# Patient Record
Sex: Female | Born: 1990 | Race: White | Hispanic: No | Marital: Married | Smoking: Never smoker
Health system: Southern US, Community
[De-identification: ages and names within clinical notes are randomized; demographics above are authoritative.]

## PROBLEM LIST (undated history)

## (undated) ENCOUNTER — Inpatient Hospital Stay: Payer: Self-pay

## (undated) DIAGNOSIS — F419 Anxiety disorder, unspecified: Secondary | ICD-10-CM

## (undated) HISTORY — PX: NO PAST SURGERIES: SHX2092

---
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
## 2012-11-12 ENCOUNTER — Emergency Department: Payer: Self-pay | Admitting: Emergency Medicine

## 2012-11-13 LAB — BASIC METABOLIC PANEL
Anion Gap: 8 (ref 7–16)
BUN: 7 mg/dL (ref 7–18)
Chloride: 106 mmol/L (ref 98–107)
Co2: 24 mmol/L (ref 21–32)
Creatinine: 0.76 mg/dL (ref 0.60–1.30)
EGFR (African American): >60
EGFR (Non-African Amer.): >60
Glucose: 83 mg/dL (ref 65–99)
Potassium: 3.6 mmol/L (ref 3.5–5.1)

## 2012-11-13 LAB — CBC WITH DIFFERENTIAL/PLATELET
Basophil #: 0 10*3/uL (ref 0.0–0.1)
Basophil %: 0.6 %
Eosinophil #: 0 10*3/uL (ref 0.0–0.7)
HCT: 39.2 % (ref 35.0–47.0)
MCV: 90 fL (ref 80–100)
Monocyte #: 0.7 x10 3/mm (ref 0.2–0.9)
Neutrophil %: 72.5 %
Platelet: 219 10*3/uL (ref 150–440)
RBC: 4.37 10*6/uL (ref 3.80–5.20)
WBC: 7.6 10*3/uL (ref 3.6–11.0)

## 2012-11-13 LAB — URINALYSIS, COMPLETE
Ketone: NEGATIVE
Ph: 5 (ref 4.5–8.0)
Protein: NEGATIVE

## 2014-09-25 ENCOUNTER — Emergency Department: Payer: Self-pay | Admitting: Emergency Medicine

## 2014-09-26 LAB — COMPREHENSIVE METABOLIC PANEL
ALBUMIN: 3.9 g/dL (ref 3.4–5.0)
ANION GAP: 6 — AB (ref 7–16)
Alkaline Phosphatase: 38 U/L — ABNORMAL LOW (ref 46–116)
BILIRUBIN TOTAL: 0.2 mg/dL (ref 0.2–1.0)
BUN: 10 mg/dL (ref 7–18)
CREATININE: 0.97 mg/dL (ref 0.60–1.30)
Calcium, Total: 8.3 mg/dL — ABNORMAL LOW (ref 8.5–10.1)
Chloride: 110 mmol/L — ABNORMAL HIGH (ref 98–107)
Co2: 25 mmol/L (ref 21–32)
EGFR (African American): >60
EGFR (Non-African Amer.): >60
Glucose: 110 mg/dL — ABNORMAL HIGH (ref 65–99)
Osmolality: 281 (ref 275–301)
POTASSIUM: 4.1 mmol/L (ref 3.5–5.1)
SGOT(AST): 27 U/L (ref 15–37)
SGPT (ALT): 21 U/L (ref 14–63)
Sodium: 141 mmol/L (ref 136–145)
TOTAL PROTEIN: 7.4 g/dL (ref 6.4–8.2)

## 2014-09-26 LAB — CBC WITH DIFFERENTIAL/PLATELET
BASOS PCT: 0.5 %
Basophil #: 0 10*3/uL (ref 0.0–0.1)
Eosinophil #: 0.1 10*3/uL (ref 0.0–0.7)
Eosinophil %: 1.5 %
HCT: 38.8 % (ref 35.0–47.0)
HGB: 12.8 g/dL (ref 12.0–16.0)
Lymphocyte #: 1.4 10*3/uL (ref 1.0–3.6)
Lymphocyte %: 21.9 %
MCH: 29.8 pg (ref 26.0–34.0)
MCHC: 32.9 g/dL (ref 32.0–36.0)
MCV: 91 fL (ref 80–100)
MONO ABS: 0.5 x10 3/mm (ref 0.2–0.9)
Monocyte %: 8.5 %
Neutrophil #: 4.2 10*3/uL (ref 1.4–6.5)
Neutrophil %: 67.6 %
Platelet: 183 10*3/uL (ref 150–440)
RBC: 4.28 10*6/uL (ref 3.80–5.20)
RDW: 12.7 % (ref 11.5–14.5)
WBC: 6.2 10*3/uL (ref 3.6–11.0)

## 2015-10-22 ENCOUNTER — Other Ambulatory Visit: Payer: Self-pay | Admitting: Orthopedic Surgery

## 2015-10-22 DIAGNOSIS — M2391 Unspecified internal derangement of right knee: Secondary | ICD-10-CM

## 2015-11-04 ENCOUNTER — Encounter (HOSPITAL_COMMUNITY): Payer: Self-pay | Admitting: *Deleted

## 2015-11-04 ENCOUNTER — Emergency Department (HOSPITAL_COMMUNITY)
Admission: EM | Admit: 2015-11-04 | Discharge: 2015-11-04 | Disposition: A | Discharge status: Home / Self Care | Payer: 59 | Attending: Emergency Medicine | Admitting: Emergency Medicine

## 2015-11-04 DIAGNOSIS — R42 Dizziness and giddiness: Secondary | ICD-10-CM | POA: Insufficient documentation

## 2015-11-04 DIAGNOSIS — H539 Unspecified visual disturbance: Secondary | ICD-10-CM | POA: Insufficient documentation

## 2015-11-04 DIAGNOSIS — F419 Anxiety disorder, unspecified: Secondary | ICD-10-CM | POA: Diagnosis not present

## 2015-11-04 DIAGNOSIS — R51 Headache: Secondary | ICD-10-CM | POA: Insufficient documentation

## 2015-11-04 DIAGNOSIS — R110 Nausea: Secondary | ICD-10-CM | POA: Insufficient documentation

## 2015-11-04 DIAGNOSIS — Z3202 Encounter for pregnancy test, result negative: Secondary | ICD-10-CM | POA: Diagnosis not present

## 2015-11-04 DIAGNOSIS — R55 Syncope and collapse: Secondary | ICD-10-CM | POA: Diagnosis present

## 2015-11-04 HISTORY — DX: Anxiety disorder, unspecified: F41.9

## 2015-11-04 LAB — CBC
HEMATOCRIT: 40.2 % (ref 36.0–46.0)
Hemoglobin: 13.4 g/dL (ref 12.0–15.0)
MCH: 29.8 pg (ref 26.0–34.0)
MCHC: 33.3 g/dL (ref 30.0–36.0)
MCV: 89.5 fL (ref 78.0–100.0)
Platelets: 235 10*3/uL (ref 150–400)
RBC: 4.49 MIL/uL (ref 3.87–5.11)
RDW: 12.9 % (ref 11.5–15.5)
WBC: 5.9 10*3/uL (ref 4.0–10.5)

## 2015-11-04 LAB — I-STAT BETA HCG BLOOD, ED (MC, WL, AP ONLY)

## 2015-11-04 LAB — BASIC METABOLIC PANEL
Anion gap: 7 (ref 5–15)
BUN: 12 mg/dL (ref 6–20)
CALCIUM: 9.2 mg/dL (ref 8.9–10.3)
CO2: 23 mmol/L (ref 22–32)
Chloride: 109 mmol/L (ref 101–111)
Creatinine, Ser: 0.78 mg/dL (ref 0.44–1.00)
GFR calc Af Amer: >60 mL/min (ref >60)
GLUCOSE: 102 mg/dL — AB (ref 65–99)
POTASSIUM: 4.3 mmol/L (ref 3.5–5.1)
Sodium: 139 mmol/L (ref 135–145)

## 2015-11-04 LAB — CBG MONITORING, ED: Glucose-Capillary: 84 mg/dL (ref 65–99)

## 2015-11-04 NOTE — Discharge Instructions (Signed)
Follow up with your doctor. Return for worsening symptoms.  Your EKG and blood work today are normal

## 2015-11-04 NOTE — ED Provider Notes (Signed)
CSN: [id number]     Arrival date & time 11/04/15  1447 History  By signing my name below, I, Gabriela Fuller, attest that this documentation has been prepared under the direction and in the presence of [facility] NP. Electronically Signed: Bethel BornBritney Fuller, ED Scribe. 11/04/2015 5:02 PM    Chief Complaint  Patient presents with  . Loss of Consciousness    Patient is a 25 y.o. female presenting with syncope. The history is provided by the patient. No language interpreter was used.  Loss of Consciousness Episode history:  Single Most recent episode:  Today Timing:  Constant Progression:  Resolved Chronicity:  New Context: normal activity   Witnessed: yes   Relieved by:  Nothing Worsened by:  Nothing tried Ineffective treatments:  None tried Associated symptoms: dizziness, headaches and nausea   Associated symptoms: no recent fall and no recent injury   Risk factors: no congenital heart disease, no seizures and no vascular disease    Gabriela Fuller is a 25 y.o. female who presents to the Emergency Department complaining of a syncopal episode this afternoon. Pt states that she was visiting a friend in the ICU and became suddenly nauseous before she "went limp". Pt states that during the episode she was pale and either closed her eyes or blacked out. Over the last few months she has had similar episodes that were less severe.  Presently she has a headache and notes feeling better. She has eaten since the episode but does not necessarily feel that food alleviated her symptoms.  Pt states that she started a new anti-anxiety medication (Paroxetine)  this week but this did not feel like an anxiety attack. Pt denies blurred vision, neck pain, and back pain.   Past Medical History  Diagnosis Date  . Anxiety    History reviewed. No pertinent past surgical history. History reviewed. No pertinent family history. Social History  Substance Use Topics  . Smoking status: Never Smoker   . Smokeless  tobacco: None  . Alcohol Use: No   OB History    No data available     Review of Systems  Eyes: Positive for visual disturbance.  Cardiovascular: Positive for syncope.  Gastrointestinal: Positive for nausea.  Musculoskeletal: Negative for back pain and neck pain.  Skin: Positive for color change.  Neurological: Positive for dizziness and headaches.  All other systems reviewed and are negative.   Allergies  Review of patient's allergies indicates no known allergies.  Home Medications   Prior to Admission medications   Not on File   BP 111/73 mmHg  Pulse 70  Temp(Src) 98.2 F (36.8 C) (Oral)  Resp 18  SpO2 99%  LMP 11/04/2015 Physical Exam  Constitutional: She is oriented to person, place, and time. She appears well-developed and well-nourished. No distress.  HENT:  Head: Normocephalic and atraumatic.  Right Ear: Tympanic membrane normal.  Left Ear: Tympanic membrane normal.  Nose: Nose normal.  Mouth/Throat: Uvula is midline, oropharynx is clear and moist and mucous membranes are normal.  Eyes: Conjunctivae and EOM are normal. Pupils are equal, round, and reactive to light.  Neck: Normal range of motion. Neck supple.  Cardiovascular: Normal rate and regular rhythm.   Pulmonary/Chest: Effort normal. No respiratory distress. She has no wheezes. She has no rales.  Abdominal: Soft. Bowel sounds are normal. There is no tenderness.  Musculoskeletal: Normal range of motion. She exhibits no edema.  Radial and pedal pulses strong, adequate circulation, good touch sensation.  Neurological: She is alert and  oriented to person, place, and time. She has normal strength. No cranial nerve deficit or sensory deficit. She displays a negative Romberg sign. Gait normal.  Reflex Scores:      Bicep reflexes are 2+ on the right side and 2+ on the left side.      Brachioradialis reflexes are 2+ on the right side and 2+ on the left side.      Patellar reflexes are 2+ on the right side and  2+ on the left side.      Achilles reflexes are 2+ on the right side and 2+ on the left side. Rapid alternating movement without difficulty. Stands on one foot without difficulty.  Skin: Skin is warm and dry.  Psychiatric: She has a normal mood and affect. Her behavior is normal.  Nursing note and vitals reviewed.   ED Course  Procedures (including critical care time) DIAGNOSTIC STUDIES: Oxygen Saturation is 99% on RA,  normal by my interpretation.    COORDINATION OF CARE: 4:55 PM Discussed treatment plan which includes lab work with pt at bedside and pt agreed to plan.  Labs Review Labs Reviewed  BASIC METABOLIC PANEL - Abnormal; Notable for the following:    Glucose, Bld 102 (*)    All other components within normal limits  CBC  CBG MONITORING, ED  I-STAT BETA HCG BLOOD, ED ([facility], [facility], [facility] ONLY)    Imaging Review No results found. I personally reviewed and evaluated these lab results as a part of my medical decision-making.  EKG: normal EKG, normal sinus rhythm with sinus arrhythmia    MDM  25 y.o. female with syncopal episode while visiting a patient here in the ICU. Episode happened after standing for a long time in a room that was very warm. Stable for d/c with normal neuro exam, labs and EKG. Discussed with the patient and all questioned fully answered. She will return if any problems arise.   Final diagnoses:  Vasovagal syncope   I personally performed the services described in this documentation, which was scribed in my presence. The recorded information has been reviewed and is accurate.    [address] [place], NP 11/04/15 1722  Rolan Bucco, MD 11/04/15 2158

## 2015-11-04 NOTE — ED Notes (Signed)
Pt reports coming to visit someone and had syncopal episode. Denies feeling bad prior, had onset of tingling to hands and nausea prior to syncopal episode. No acute distress noted at triage.

## 2015-11-08 ENCOUNTER — Ambulatory Visit: Payer: Self-pay

## 2015-11-12 ENCOUNTER — Ambulatory Visit
Admission: RE | Admit: 2015-11-12 | Discharge: 2015-11-12 | Disposition: A | Discharge status: Home / Self Care | Payer: 59 | Source: Ambulatory Visit | Attending: Orthopedic Surgery | Admitting: Orthopedic Surgery

## 2015-11-12 DIAGNOSIS — M2391 Unspecified internal derangement of right knee: Secondary | ICD-10-CM | POA: Diagnosis not present

## 2015-12-09 DIAGNOSIS — F32A Depression, unspecified: Secondary | ICD-10-CM | POA: Insufficient documentation

## 2015-12-09 DIAGNOSIS — F419 Anxiety disorder, unspecified: Secondary | ICD-10-CM | POA: Insufficient documentation

## 2016-01-22 ENCOUNTER — Other Ambulatory Visit: Payer: Self-pay | Admitting: Obstetrics and Gynecology

## 2016-01-22 DIAGNOSIS — Z369 Encounter for antenatal screening, unspecified: Secondary | ICD-10-CM

## 2016-02-20 LAB — OB RESULTS CONSOLE HIV ANTIBODY (ROUTINE TESTING): HIV: NONREACTIVE

## 2016-02-20 LAB — OB RESULTS CONSOLE ABO/RH: RH Type: POSITIVE

## 2016-02-20 LAB — OB RESULTS CONSOLE RUBELLA ANTIBODY, IGM: Rubella: IMMUNE

## 2016-02-20 LAB — OB RESULTS CONSOLE VARICELLA ZOSTER ANTIBODY, IGG: Varicella: NON-IMMUNE/NOT IMMUNE

## 2016-02-20 LAB — OB RESULTS CONSOLE HEPATITIS B SURFACE ANTIGEN: Hepatitis B Surface Ag: NEGATIVE

## 2016-02-20 LAB — OB RESULTS CONSOLE ANTIBODY SCREEN: ANTIBODY SCREEN: NEGATIVE

## 2016-03-09 ENCOUNTER — Ambulatory Visit
Admission: RE | Admit: 2016-03-09 | Discharge: 2016-03-09 | Disposition: A | Discharge status: Home / Self Care | Payer: 59 | Source: Ambulatory Visit | Attending: Obstetrics and Gynecology | Admitting: Obstetrics and Gynecology

## 2016-03-09 ENCOUNTER — Ambulatory Visit (HOSPITAL_BASED_OUTPATIENT_CLINIC_OR_DEPARTMENT_OTHER)
Admission: RE | Admit: 2016-03-09 | Discharge: 2016-03-09 | Disposition: A | Discharge status: Home / Self Care | Payer: 59 | Source: Ambulatory Visit | Attending: Maternal and Fetal Medicine | Admitting: Maternal and Fetal Medicine

## 2016-03-09 VITALS — BP 108/65 | HR 80 | Temp 97.9°F | Resp 17 | Ht 67.0 in | Wt 160.0 lb

## 2016-03-09 DIAGNOSIS — N83292 Other ovarian cyst, left side: Secondary | ICD-10-CM | POA: Diagnosis not present

## 2016-03-09 DIAGNOSIS — Z820 Family history of epilepsy and other diseases of the nervous system: Secondary | ICD-10-CM

## 2016-03-09 DIAGNOSIS — Z3A13 13 weeks gestation of pregnancy: Secondary | ICD-10-CM | POA: Diagnosis not present

## 2016-03-09 DIAGNOSIS — Z3402 Encounter for supervision of normal first pregnancy, second trimester: Secondary | ICD-10-CM | POA: Insufficient documentation

## 2016-03-09 DIAGNOSIS — Z818 Family history of other mental and behavioral disorders: Secondary | ICD-10-CM

## 2016-03-09 DIAGNOSIS — Z369 Encounter for antenatal screening, unspecified: Secondary | ICD-10-CM

## 2016-03-09 NOTE — Progress Notes (Addendum)
Referring physician:  [facility] Length of Consultation: 40 minutes   Gabriela Fuller  was referred to [facility] for genetic counseling to review prenatal screening and testing options.  This note summarizes Gabriela information we discussed.    We offered Gabriela following routine screening tests for this pregnancy:  First trimester screening, which includes nuchal translucency ultrasound screen and first trimester maternal serum marker screening.  Gabriela nuchal translucency has approximately an 80% detection rate for Down syndrome and can be positive for other chromosome abnormalities as well as congenital heart defects.  When combined with a maternal serum marker screening, Gabriela detection rate is up to 90% for Down syndrome and up to 97% for trisomy 18.     Maternal serum marker screening, a blood test that measures pregnancy proteins, can provide risk assessments for Down syndrome, trisomy 18, and open neural tube defects (spina bifida, anencephaly). Because it does not directly examine Gabriela fetus, it cannot positively diagnose or rule out these problems.  Targeted ultrasound uses high frequency sound waves to create an image of Gabriela developing fetus.  An ultrasound is often recommended as a routine means of evaluating Gabriela pregnancy.  It is also used to screen for fetal anatomy problems (for example, a heart defect) that might be suggestive of a chromosomal or other abnormality.   Should these screening tests indicate an increased concern, then Gabriela following additional testing options would be offered:  Gabriela chorionic villus sampling procedure is available for first trimester chromosome analysis.  This involves Gabriela withdrawal of a small amount of chorionic villi (tissue from Gabriela developing placenta).  Risk of pregnancy loss is estimated to be approximately 1 in 200 to 1 in 100 (0.5 to 1%).  There is approximately a 1% (1 in 100) chance that Gabriela CVS chromosome results will be unclear.   Chorionic villi cannot be tested for neural tube defects.     Amniocentesis involves Gabriela removal of a small amount of amniotic fluid from Gabriela sac surrounding Gabriela fetus with Gabriela use of a thin needle inserted through Gabriela maternal abdomen and uterus.  Ultrasound guidance is used throughout Gabriela procedure.  Fetal cells from amniotic fluid are directly evaluated and > 99.5% of chromosome problems and > 98% of open neural tube defects can be detected. This procedure is generally performed after Gabriela 15th week of pregnancy.  Gabriela main risks to this procedure include complications leading to miscarriage in less than 1 in 200 cases (0.5%).  As another option for information if Gabriela pregnancy is suspected to be an an increased chance for certain chromosome conditions, we also reviewed Gabriela availability of cell free fetal DNA testing from maternal blood to determine whether or not Gabriela baby may have either Down syndrome, trisomy 37, or trisomy 71.  This test utilizes a maternal blood sample and DNA sequencing technology to isolate circulating cell free fetal DNA from maternal plasma.  Gabriela fetal DNA can then be analyzed for DNA sequences that are derived from Gabriela three most common chromosomes involved in aneuploidy, chromosomes 13, 18, and 21.  If Gabriela overall amount of DNA is greater than Gabriela expected level for any of these chromosomes, aneuploidy is suspected.  While we do not consider it a replacement for invasive testing and karyotype analysis, a negative result from this testing would be reassuring, though not a guarantee of a normal chromosome complement for Gabriela baby.  An abnormal result is certainly suggestive of an abnormal chromosome complement, though we  would still recommend CVS or amniocentesis to confirm any findings from this testing.  Cystic Fibrosis and Spinal Muscular Atrophy (SMA) screening were also discussed with Gabriela Fuller. Both conditions are recessive, which means that both parents must be carriers in  order to have a child with Gabriela disease.  Cystic fibrosis (CF) is one of Gabriela most common genetic conditions in persons of Caucasian ancestry.  This condition occurs in approximately 1 in 2,500 Caucasian persons and results in thickened secretions in Gabriela lungs, digestive, and reproductive systems.  For a baby to be at risk for having CF, both of Gabriela parents must be carriers for this condition.  Approximately 1 in 2525 Caucasian persons is a carrier for CF.  Current carrier testing looks for Gabriela most common mutations in Gabriela gene for CF and can detect approximately 90% of carriers in Gabriela Caucasian population.  This means that Gabriela carrier screening can greatly reduce, but cannot eliminate, Gabriela chance for an individual to have a child with CF.  If an individual is found to be a carrier for CF, then carrier testing would be available for Gabriela partner. As part of Gabriela Fuller's newborn screening profile, all babies born in [place] state of [place] will have a two-tier screening process.  Specimens are first tested to determine Gabriela concentration of immunoreactive trypsinogen (IRT).  Gabriela top 5% of specimens with Gabriela highest IRT values then undergo DNA testing using a panel of over 40 common CF mutations. SMA is a neurodegenerative disorder that leads to atrophy of skeletal muscle and overall weakness.  This condition is also more prevalent in Gabriela Caucasian population, with 1 in 40-1 in 60 persons being a carrier and 1 in 6,000-1 in 10,000 children being affected.  There are multiple forms of Gabriela disease, with some causing death in infancy to other forms with survival into adulthood.  Gabriela genetics of SMA is complex, but carrier screening can detect up to 95% of carriers in Gabriela Caucasian population.  Similar to CF, a negative result can greatly reduce, but cannot eliminate, Gabriela chance to have a child with SMA.  We obtained a detailed family history and pregnancy history.  Gabriela Fuller reported a great uncle (her maternal  grandfather's brother) with intellectual disability and physical disabilities. This great uncle functions on Gabriela level of about an 24-year-old. He also often uses a wheelchair or walks with assistance and a hunch. However, he also had polio when he was young and Gabriela Fuller believes he may have been higher functioning when he was younger. Gabriela cause for his developmental and physical differences is not known.  There may be many causes for physical and developmental differences including some which are inherited and others which are not.  Without a known cause, it is difficult to determine Gabriela chance for other family members to have a similar condition.  Fragile X carrier testing was offered because this condition is Gabriela most common inherited cause for intellectual disabilities in males, though this may be less likely to be a concern for this family because Gabriela affected relative is related through an apparently unaffected female relative.  Gabriela Fuller declined carrier screening for Fragile X.  We are happy to review medical records on this relative if they become available. Gabriela Fuller also has mild anxiety. We discussed that mental health conditions can be multifactorial and that her children might have a higher chance of also having a mental illness. However, there is currently no genetic testing for this and  Gabriela Fuller is already familiar with Gabriela symptoms of and treatments for anxiety. She is feeling well without using her medications.  Gabriela remainder of Gabriela family history was reported to be unremarkable for birth defects, mental retardation, recurrent pregnancy loss or known chromosome abnormalities.  Gabriela Fuller stated that this is her first pregnancy.  She reported no complications or exposures that would be expected to increase Gabriela risk for birth defects.  After consideration of Gabriela options, Gabriela Fuller elected to proceed with an ultrasound only and to decline first trimester screening as well as CF, SMA and  Fragile X carrier testing.  An ultrasound was performed at Gabriela time of Gabriela visit.  Gabriela gestational age was consistent with  12 weeks.  Fetal anatomy could not be assessed due to early gestational age.  Please refer to Gabriela ultrasound report for details of that study.  Gabriela Fuller was encouraged to call with questions or concerns.  We can be contacted at [phone].    Cherly Anderson, MS, CGC  I was immediately available and supervising. Camelia Phenes, MD [facility]

## 2016-08-19 LAB — OB RESULTS CONSOLE GC/CHLAMYDIA
CHLAMYDIA, DNA PROBE: NEGATIVE
GC PROBE AMP, GENITAL: NEGATIVE

## 2016-08-19 LAB — OB RESULTS CONSOLE GBS: GBS: POSITIVE

## 2016-08-29 ENCOUNTER — Encounter: Payer: Self-pay | Admitting: *Deleted

## 2016-08-29 ENCOUNTER — Inpatient Hospital Stay
Admission: EM | Admit: 2016-08-29 | Discharge: 2016-08-29 | Disposition: A | Discharge status: Home / Self Care | Payer: 59 | Attending: Obstetrics and Gynecology | Admitting: Obstetrics and Gynecology

## 2016-08-29 DIAGNOSIS — O26893 Other specified pregnancy related conditions, third trimester: Secondary | ICD-10-CM | POA: Diagnosis present

## 2016-08-29 DIAGNOSIS — Z3A37 37 weeks gestation of pregnancy: Secondary | ICD-10-CM | POA: Insufficient documentation

## 2016-08-29 DIAGNOSIS — Z820 Family history of epilepsy and other diseases of the nervous system: Secondary | ICD-10-CM

## 2016-08-29 DIAGNOSIS — Z818 Family history of other mental and behavioral disorders: Secondary | ICD-10-CM

## 2016-08-29 NOTE — Discharge Instructions (Signed)
If you have any questions or concerns please call the on call physician.   If you have urgent concerns please come to the nearest Emergency Dept. For evaluation. You may also call the nurse's desk at the birthplace [phone].  [facility] Wednesday January 3rd, 2018.

## 2016-08-29 NOTE — Discharge Summary (Signed)
  Suzy Bouchardhomas J Cailey Trigueros, MD  Obstetrics    [] Hide copied text [] Hover for attribution information Patient ID: Gabriela Fuller, female   DOB: [birth year], 25 y.o.   MRN: [id number] Gabriela Fuller [birth year] G1 P0 2051w4d presents for leakage of fluid  +LOF , No vaginal bleeding , O;LMP 11/04/2015  ABDsoft  CX cx 1 cm by RN ,Nitrazine neg  NSTreactive , no decels   Labs: none A: no evidence of LOF  Reassuring fetal monitoring  P:d/c home with precautions      Electronically signed by Ihor Austinhomas J Reyaansh Merlo

## 2016-08-29 NOTE — Progress Notes (Signed)
Patient ID: Gabriela Fuller, female   DOB: [birth year], 25 y.o.   MRN: [id number] Gabriela Fuller [birth year] G1 P0 2270w4d presents for leakage of fluid  +LOF , No vaginal bleeding , O;LMP 11/04/2015  ABDsoft  CX cx 1 cm by RN ,Nitrazine neg  NSTreactive , no decels   Labs: none A: no evidence of LOF  Reassuring fetal monitoring  P:d/c home with precautions

## 2016-08-29 NOTE — OB Triage Note (Signed)
Patient arrived to obs 3 with complaints of lower abdominal pain. Patient states that she has felt like she was leaking fluid.  Nitrazine check was negative.  Cervical exam 1/50/-2,-3.  Fetal heart pattern with moderate variability, 15x15 accels.

## 2016-08-31 NOTE — L&D Delivery Note (Signed)
Delivery Note At 8:22 PM a viable and healthy female "Gabriela Fuller" was delivered via Vaginal, Spontaneous Delivery (Presentation: LOA, nuchal arm compound presentation;  ).  APGAR:8 , 9; weight  8lbs 1oz.   Placenta status: intact, spontaneous.  Cord: 3VC  Anesthesia:  epidural Episiotomy:  none Lacerations: Periurethral, vaginal Suture Repair: 2.0 3.0 vicryl Est. Blood Loss (mL):  400  Mom to postpartum.  Baby to Couplet care / Skin to Skin.  25yo G1P0 at 40+2wks presented in active labor. AROM for clear fluid. No pitocin required. Pushed over an intact perineum. Fetal head delivered followed immediately by anterior shoulder. Vigorous crying and baby placed on maternal abdomen. Placenta delivered spontaneously and intact. Right periurethral and left hymenal tear repaired for hemostasis. Fundus firm, postpartum pit given.  Christeen DouglasBEASLEY, Hanan Moen 09/17/2016, 8:51 PM

## 2016-09-02 ENCOUNTER — Observation Stay: Payer: 59

## 2016-09-02 ENCOUNTER — Observation Stay
Admission: EM | Admit: 2016-09-02 | Discharge: 2016-09-02 | Disposition: A | Discharge status: Home / Self Care | Payer: 59 | Attending: Obstetrics and Gynecology | Admitting: Obstetrics and Gynecology

## 2016-09-02 ENCOUNTER — Inpatient Hospital Stay: Payer: 59

## 2016-09-02 DIAGNOSIS — Z3A38 38 weeks gestation of pregnancy: Secondary | ICD-10-CM | POA: Insufficient documentation

## 2016-09-02 DIAGNOSIS — F419 Anxiety disorder, unspecified: Secondary | ICD-10-CM | POA: Diagnosis not present

## 2016-09-02 DIAGNOSIS — O26899 Other specified pregnancy related conditions, unspecified trimester: Secondary | ICD-10-CM | POA: Diagnosis present

## 2016-09-02 DIAGNOSIS — O26893 Other specified pregnancy related conditions, third trimester: Secondary | ICD-10-CM | POA: Diagnosis present

## 2016-09-02 DIAGNOSIS — R109 Unspecified abdominal pain: Secondary | ICD-10-CM

## 2016-09-02 DIAGNOSIS — Z820 Family history of epilepsy and other diseases of the nervous system: Secondary | ICD-10-CM

## 2016-09-02 DIAGNOSIS — R1010 Upper abdominal pain, unspecified: Secondary | ICD-10-CM | POA: Insufficient documentation

## 2016-09-02 DIAGNOSIS — Z818 Family history of other mental and behavioral disorders: Secondary | ICD-10-CM

## 2016-09-02 LAB — COMPREHENSIVE METABOLIC PANEL
ALT: 12 U/L — AB (ref 14–54)
AST: 23 U/L (ref 15–41)
Albumin: 3.5 g/dL (ref 3.5–5.0)
Alkaline Phosphatase: 126 U/L (ref 38–126)
Anion gap: 6 (ref 5–15)
BILIRUBIN TOTAL: 0.5 mg/dL (ref 0.3–1.2)
BUN: 9 mg/dL (ref 6–20)
CHLORIDE: 107 mmol/L (ref 101–111)
CO2: 21 mmol/L — ABNORMAL LOW (ref 22–32)
CREATININE: 0.63 mg/dL (ref 0.44–1.00)
Calcium: 9.2 mg/dL (ref 8.9–10.3)
Glucose, Bld: 85 mg/dL (ref 65–99)
Potassium: 4 mmol/L (ref 3.5–5.1)
Sodium: 134 mmol/L — ABNORMAL LOW (ref 135–145)
TOTAL PROTEIN: 7.7 g/dL (ref 6.5–8.1)

## 2016-09-02 LAB — URINALYSIS, ROUTINE W REFLEX MICROSCOPIC
BILIRUBIN URINE: NEGATIVE
Glucose, UA: NEGATIVE mg/dL
HGB URINE DIPSTICK: NEGATIVE
KETONES UR: NEGATIVE mg/dL
Leukocytes, UA: NEGATIVE
NITRITE: NEGATIVE
PROTEIN: NEGATIVE mg/dL
Specific Gravity, Urine: 1.014 (ref 1.005–1.030)
pH: 7 (ref 5.0–8.0)

## 2016-09-02 LAB — CBC
HCT: 36.3 % (ref 35.0–47.0)
Hemoglobin: 12.8 g/dL (ref 12.0–16.0)
MCH: 31.2 pg (ref 26.0–34.0)
MCHC: 35.2 g/dL (ref 32.0–36.0)
MCV: 88.7 fL (ref 80.0–100.0)
PLATELETS: 176 10*3/uL (ref 150–440)
RBC: 4.09 MIL/uL (ref 3.80–5.20)
RDW: 13 % (ref 11.5–14.5)
WBC: 10.1 10*3/uL (ref 3.6–11.0)

## 2016-09-02 LAB — LIPASE, BLOOD: LIPASE: 28 U/L (ref 11–51)

## 2016-09-02 LAB — AMYLASE: AMYLASE: 43 U/L (ref 28–100)

## 2016-09-02 MED ORDER — MORPHINE SULFATE (PF) 4 MG/ML IV SOLN
5.0000 mg | Freq: Once | INTRAVENOUS | Status: AC
  Administered 2016-09-02: 5 mg via INTRAMUSCULAR
  Filled 2016-09-02: qty 2

## 2016-09-02 NOTE — Discharge Summary (Signed)
  Suzy Bouchardhomas J Schermerhorn, MD  Obstetrics    [] Hide copied text [] Hover for attribution information Patient ID: Gabriela Fuller, female   DOB: [birth year], 26 y.o.   MRN: [id number] Gabriela Fuller [birth year] G2 P0 6771w1d presents for 38+1present with acute onset of upper abdominal pain 10 am , sharp in nature. Felt like gas , but she was unable to pass gas. +BM today. No N/V  No diarrhea . Some upper back pain . No prior h/o of similar pain .  noLOF , no vaginal bleeding , PHX : anxiety  PSHX : none Social : no etoh , no tobacco  Allergies NKDA O;BP 133/74   Pulse 78   Temp 98.4 F (36.9 C) (Oral)   LMP 11/04/2015  ABDMild upper abd ttp . No rebound, no mass CX checked at 1900 TFT / 755  blt vtx . No blood on glove NST150 . + accels , no decels , CTX q 3-6 min  Labs: CMP , CBC , UA - neg amylase and lipase - wnl  Gallbladder u/s : neg  After 7 hours observation and a single 5 mg IM morphine at 1400 pain is better  A: upper abd pain , latent labor. Doubt abruption given improvement in pain and CAT1 fetal monitoring . Possible GI etiology  P:Pt reassured  D/c home with Norco 5/325mg  1po q 6 hr prn  RTC / L+D if pain increases or vaginal bleeding , lof or increasing CTX . Cont fetal kick counts     Electronically signed by Suzy Bouchardhomas J Schermerhorn, MD at 09/02/2016 7:41 PM      ED to Hosp-Admission

## 2016-09-02 NOTE — Progress Notes (Signed)
Patient ID: Gabriela Fuller, female   DOB: [birth year], 26 y.o.   MRN: [id number] Gabriela AnonMackenzie Selvage [birth year] G2 P0 10837w1d presents for 38+1present with acute onset of upper abdominal pain 10 am , sharp in nature. Felt like gas , but she was unable to pass gas. +BM today. No N/V  No diarrhea . Some upper back pain . No prior h/o of similar pain .  noLOF , no vaginal bleeding , PHX : anxiety  PSHX : none Social : no etoh , no tobacco  Allergies NKDA O;BP 133/74   Pulse 78   Temp 98.4 F (36.9 C) (Oral)   LMP 11/04/2015  ABDMild upper abd ttp . No rebound, no mass CX checked at 1900 TFT / 755  blt vtx . No blood on glove NST150 . + accels , no decels , CTX q 3-6 min  Labs: CMP , CBC , UA - neg amylase and lipase - wnl  Gallbladder u/s : neg  After 7 hours observation and a single 5 mg IM morphine at 1400 pain is better  A: upper abd pain , latent labor. Doubt abruption given improvement in pain and CAT1 fetal monitoring . Possible GI etiology  P:Pt reassured  D/c home with Norco 5/325mg  1po q 6 hr prn  RTC / L+D if pain increases or vaginal bleeding , lof or increasing CTX . Cont fetal kick counts

## 2016-09-02 NOTE — Discharge Instructions (Signed)
Come back if: ° °Big gush of fluids °Heavy vaginal bleeding °Decreased fetal movement °Temp over 100.4 °Contractions every 3-5 min lasting at least one hour ° °Stay well hydrated and get plenty of rest! °

## 2016-09-02 NOTE — OB Triage Note (Signed)
Pt arrived from home to ED with c/o upper abdominal pain starting at 1000 with 8/10 constant sharp pain, no bleeding, leaking fluid, no recent sex. Pt placed on monitor and oriented to room.

## 2016-09-16 ENCOUNTER — Inpatient Hospital Stay
Admission: EM | Admit: 2016-09-16 | Discharge: 2016-09-19 | DRG: 775 | Disposition: A | Discharge status: Home / Self Care | Payer: 59 | Attending: Obstetrics and Gynecology | Admitting: Obstetrics and Gynecology

## 2016-09-16 DIAGNOSIS — Z818 Family history of other mental and behavioral disorders: Secondary | ICD-10-CM

## 2016-09-16 DIAGNOSIS — O99824 Streptococcus B carrier state complicating childbirth: Secondary | ICD-10-CM | POA: Diagnosis present

## 2016-09-16 DIAGNOSIS — Z6790 Unspecified blood type, Rh positive: Secondary | ICD-10-CM | POA: Diagnosis not present

## 2016-09-16 DIAGNOSIS — Z820 Family history of epilepsy and other diseases of the nervous system: Secondary | ICD-10-CM

## 2016-09-16 DIAGNOSIS — Z3490 Encounter for supervision of normal pregnancy, unspecified, unspecified trimester: Secondary | ICD-10-CM

## 2016-09-16 DIAGNOSIS — O326XX0 Maternal care for compound presentation, not applicable or unspecified: Secondary | ICD-10-CM | POA: Diagnosis present

## 2016-09-16 DIAGNOSIS — Z3493 Encounter for supervision of normal pregnancy, unspecified, third trimester: Secondary | ICD-10-CM | POA: Diagnosis present

## 2016-09-16 DIAGNOSIS — Z3A40 40 weeks gestation of pregnancy: Secondary | ICD-10-CM

## 2016-09-16 MED ORDER — SODIUM CHLORIDE 0.9 % IV SOLN
2.0000 g | Freq: Once | INTRAVENOUS | Status: AC
  Administered 2016-09-17: 2 g via INTRAVENOUS
  Filled 2016-09-16: qty 2000

## 2016-09-16 MED ORDER — BUTORPHANOL TARTRATE 1 MG/ML IJ SOLN
1.0000 mg | INTRAMUSCULAR | Status: DC | PRN
  Administered 2016-09-17 (×2): 1 mg via INTRAVENOUS
  Administered 2016-09-17: 2 mg via INTRAVENOUS
  Filled 2016-09-16: qty 2
  Filled 2016-09-16 (×2): qty 1

## 2016-09-16 MED ORDER — LACTATED RINGERS IV SOLN
INTRAVENOUS | Status: DC
  Administered 2016-09-16 – 2016-09-17 (×2): via INTRAVENOUS

## 2016-09-16 MED ORDER — LACTATED RINGERS IV SOLN
500.0000 mL | INTRAVENOUS | Status: DC | PRN
  Administered 2016-09-17: 500 mL via INTRAVENOUS

## 2016-09-16 MED ORDER — SODIUM CHLORIDE 0.9 % IV SOLN
1.0000 g | INTRAVENOUS | Status: DC
  Administered 2016-09-17 (×4): 1 g via INTRAVENOUS
  Filled 2016-09-16 (×4): qty 1000

## 2016-09-17 ENCOUNTER — Inpatient Hospital Stay: Payer: 59 | Admitting: Anesthesiology

## 2016-09-17 ENCOUNTER — Encounter: Payer: Self-pay | Admitting: Anesthesiology

## 2016-09-17 LAB — CBC
HEMATOCRIT: 36.6 % (ref 35.0–47.0)
HEMOGLOBIN: 12.8 g/dL (ref 12.0–16.0)
MCH: 31.4 pg (ref 26.0–34.0)
MCHC: 35.1 g/dL (ref 32.0–36.0)
MCV: 89.5 fL (ref 80.0–100.0)
PLATELETS: 190 10*3/uL (ref 150–440)
RBC: 4.09 MIL/uL (ref 3.80–5.20)
RDW: 13.1 % (ref 11.5–14.5)
WBC: 14.1 10*3/uL — AB (ref 3.6–11.0)

## 2016-09-17 LAB — CHLAMYDIA/NGC RT PCR (ARMC ONLY)
CHLAMYDIA TR: NOT DETECTED
N gonorrhoeae: NOT DETECTED

## 2016-09-17 LAB — TYPE AND SCREEN
ABO/RH(D): O POS
Antibody Screen: NEGATIVE

## 2016-09-17 MED ORDER — ACETAMINOPHEN 325 MG PO TABS
ORAL_TABLET | ORAL | Status: AC
  Administered 2016-09-17: 650 mg via ORAL
  Filled 2016-09-17: qty 2

## 2016-09-17 MED ORDER — ONDANSETRON HCL 4 MG/2ML IJ SOLN: 4.0000 mg | Freq: Three times a day (TID) | INTRAMUSCULAR | Status: DC | PRN

## 2016-09-17 MED ORDER — MEPERIDINE HCL 25 MG/ML IJ SOLN: 6.2500 mg | INTRAMUSCULAR | Status: DC | PRN

## 2016-09-17 MED ORDER — LIDOCAINE-EPINEPHRINE (PF) 1.5 %-1:200000 IJ SOLN
INTRAMUSCULAR | Status: DC | PRN
  Administered 2016-09-17: 3 mL via EPIDURAL

## 2016-09-17 MED ORDER — NALBUPHINE HCL 10 MG/ML IJ SOLN: 5.0000 mg | INTRAMUSCULAR | Status: DC | PRN

## 2016-09-17 MED ORDER — OXYTOCIN 10 UNIT/ML IJ SOLN
INTRAMUSCULAR | Status: AC
  Filled 2016-09-17: qty 2

## 2016-09-17 MED ORDER — DIPHENHYDRAMINE HCL 50 MG/ML IJ SOLN: 12.5000 mg | INTRAMUSCULAR | Status: DC | PRN

## 2016-09-17 MED ORDER — ONDANSETRON HCL 4 MG/2ML IJ SOLN
4.0000 mg | Freq: Four times a day (QID) | INTRAMUSCULAR | Status: DC
  Administered 2016-09-17: 4 mg via INTRAVENOUS

## 2016-09-17 MED ORDER — ACETAMINOPHEN 325 MG PO TABS
650.0000 mg | ORAL_TABLET | ORAL | Status: DC | PRN
  Administered 2016-09-17 – 2016-09-18 (×2): 650 mg via ORAL
  Filled 2016-09-17: qty 2

## 2016-09-17 MED ORDER — MISOPROSTOL 200 MCG PO TABS
ORAL_TABLET | ORAL | Status: AC
  Filled 2016-09-17: qty 4

## 2016-09-17 MED ORDER — OXYTOCIN 40 UNITS IN LACTATED RINGERS INFUSION - SIMPLE MED
2.5000 [IU]/h | INTRAVENOUS | Status: DC
  Administered 2016-09-17: 2.5 [IU]/h via INTRAVENOUS

## 2016-09-17 MED ORDER — DIPHENHYDRAMINE HCL 25 MG PO CAPS: 25.0000 mg | ORAL_CAPSULE | ORAL | Status: DC | PRN

## 2016-09-17 MED ORDER — FENTANYL 2.5 MCG/ML W/ROPIVACAINE 0.2% IN NS 100 ML EPIDURAL INFUSION (ARMC-ANES)
EPIDURAL | Status: DC | PRN
  Administered 2016-09-17: 250 ug via EPIDURAL
  Administered 2016-09-17: 10 mL/h via EPIDURAL

## 2016-09-17 MED ORDER — ONDANSETRON HCL 4 MG/2ML IJ SOLN
INTRAMUSCULAR | Status: AC
  Filled 2016-09-17: qty 2

## 2016-09-17 MED ORDER — FENTANYL 2.5 MCG/ML W/ROPIVACAINE 0.2% IN NS 100 ML EPIDURAL INFUSION (ARMC-ANES)
EPIDURAL | Status: AC
  Filled 2016-09-17: qty 100

## 2016-09-17 MED ORDER — FENTANYL 2.5 MCG/ML W/ROPIVACAINE 0.2% IN NS 100 ML EPIDURAL INFUSION (ARMC-ANES): 10.0000 mL/h | EPIDURAL | Status: DC

## 2016-09-17 MED ORDER — NALBUPHINE HCL 10 MG/ML IJ SOLN: 5.0000 mg | Freq: Once | INTRAMUSCULAR | Status: DC | PRN

## 2016-09-17 MED ORDER — AMMONIA AROMATIC IN INHA
RESPIRATORY_TRACT | Status: AC
  Filled 2016-09-17: qty 10

## 2016-09-17 MED ORDER — NALOXONE HCL 0.4 MG/ML IJ SOLN: 0.4000 mg | INTRAMUSCULAR | Status: DC | PRN

## 2016-09-17 MED ORDER — OXYTOCIN BOLUS FROM INFUSION
500.0000 mL | Freq: Once | INTRAVENOUS | Status: AC
  Administered 2016-09-17: 500 mL via INTRAVENOUS

## 2016-09-17 MED ORDER — LIDOCAINE HCL (PF) 1 % IJ SOLN
INTRAMUSCULAR | Status: AC
  Filled 2016-09-17: qty 30

## 2016-09-17 MED ORDER — PROMETHAZINE HCL 25 MG/ML IJ SOLN: 25.0000 mg | Freq: Four times a day (QID) | INTRAMUSCULAR | Status: DC | PRN

## 2016-09-17 MED ORDER — SODIUM CHLORIDE 0.9% FLUSH: 3.0000 mL | INTRAVENOUS | Status: DC | PRN

## 2016-09-17 MED ORDER — NALOXONE HCL 2 MG/2ML IJ SOSY
1.0000 ug/kg/h | PREFILLED_SYRINGE | INTRAMUSCULAR | Status: DC | PRN
  Filled 2016-09-17: qty 2

## 2016-09-17 MED ORDER — LIDOCAINE HCL (PF) 1 % IJ SOLN
INTRAMUSCULAR | Status: DC | PRN
  Administered 2016-09-17: 3 mL via SUBCUTANEOUS

## 2016-09-17 MED ORDER — SODIUM CHLORIDE 0.9 % IV SOLN
INTRAVENOUS | Status: DC | PRN
  Administered 2016-09-17 (×2): 5 mL via EPIDURAL

## 2016-09-17 MED ORDER — KETOROLAC TROMETHAMINE 30 MG/ML IJ SOLN: 30.0000 mg | Freq: Four times a day (QID) | INTRAMUSCULAR | Status: DC | PRN

## 2016-09-17 MED ORDER — IBUPROFEN 600 MG PO TABS
ORAL_TABLET | ORAL | Status: AC
  Administered 2016-09-17: 600 mg
  Filled 2016-09-17: qty 1

## 2016-09-17 NOTE — Anesthesia Preprocedure Evaluation (Signed)
Anesthesia Evaluation  Patient identified by MRN, date of birth, ID band Patient awake    Reviewed: Allergy & Precautions, H&P , NPO status , Patient's Chart, lab work & pertinent test results, reviewed documented beta blocker date and time   History of Anesthesia Complications Negative for: history of anesthetic complications  Airway Mallampati: I  TM Distance: >3 FB Neck ROM: full    Dental no notable dental hx. (+) Teeth Intact   Pulmonary neg pulmonary ROS,    Pulmonary exam normal breath sounds clear to auscultation       Cardiovascular Exercise Tolerance: Good negative cardio ROS Normal cardiovascular exam Rhythm:regular Rate:Normal     Neuro/Psych negative neurological ROS  negative psych ROS   GI/Hepatic Neg liver ROS, GERD  ,  Endo/Other  negative endocrine ROS  Renal/GU negative Renal ROS  negative genitourinary   Musculoskeletal   Abdominal   Peds  Hematology negative hematology ROS (+)   Anesthesia Other Findings Past Medical History: No date: Anxiety   Reproductive/Obstetrics (+) Pregnancy                             Anesthesia Physical Anesthesia Plan  ASA: II  Anesthesia Plan: Epidural   Post-op Pain Management:    Induction:   Airway Management Planned:   Additional Equipment:   Intra-op Plan:   Post-operative Plan:   Informed Consent: I have reviewed the patients History and Physical, chart, labs and discussed the procedure including the risks, benefits and alternatives for the proposed anesthesia with the patient or authorized representative who has indicated his/her understanding and acceptance.   Dental Advisory Given  Plan Discussed with: Anesthesiologist, CRNA and Surgeon  Anesthesia Plan Comments:         Anesthesia Quick Evaluation

## 2016-09-17 NOTE — Progress Notes (Signed)
Fletcher AnonMackenzie Lull is a 26 y.o. G1P0 at 6852w2d in active labor Subjective: Comfortable with epidural Difficult to assess contractions  Objective: BP 119/75   Pulse 79   Temp 98 F (36.7 C) (Oral)   Resp 16   Ht 5\' 7"  (1.702 m)   Wt 188 lb (85.3 kg)   LMP 11/04/2015   BMI 29.44 kg/m  I/O last 3 completed shifts: In: 435.4 [I.V.:385.4; IV Piggyback:50] Out: -  Total I/O In: -  Out: 120 [Urine:120]  FHT:  FHR: 145 bpm, variability: minimal ,  accelerations:  Absent,  decelerations:  Absent UC:   Likely regular because of cervical change, but not picking up well SVE:   Dilation: 7 Effacement (%): 80 Station: 0   Labs: Lab Results  Component Value Date   WBC 14.1 (H) 09/16/2016   HGB 12.8 09/16/2016   HCT 36.6 09/16/2016   MCV 89.5 09/16/2016   PLT 190 09/16/2016    Assessment / Plan: Spontaneous labor, progressing normally  Labor: Progressing normally, IUPC placed and FSE placed Zofran for nausea Anticipated MOD:  NSVD  Geniya Fulgham 09/17/2016, 8:30 AM

## 2016-09-17 NOTE — Progress Notes (Signed)
Pt complains of feeling sensation and pain on the left side. Pt repositioned to left side lying for 30 minutes with no relief. Notified anesthesia and was told to increase rate to 20 mL/hr for an hour and then decrease rate to 12 mL/hr.

## 2016-09-17 NOTE — Anesthesia Procedure Notes (Signed)
Epidural Patient location during procedure: OB Start time: 09/17/2016 5:03 AM End time: 09/17/2016 5:13 AM  Staffing Anesthesiologist: Lenard SimmerKARENZ, Jameek Bruntz Performed: anesthesiologist   Preanesthetic Checklist Completed: patient identified, site marked, surgical consent, pre-op evaluation, timeout performed, IV checked, risks and benefits discussed and monitors and equipment checked  Epidural Patient position: sitting Prep: ChloraPrep Patient monitoring: heart rate, continuous pulse ox and blood pressure Approach: midline Location: L3-L4 Injection technique: LOR saline  Needle:  Needle type: Tuohy  Needle gauge: 17 G Needle length: 9 cm and 9 Needle insertion depth: 5 cm Catheter type: closed end flexible Catheter size: 19 Gauge Catheter at skin depth: 10 cm Test dose: negative and 1.5% lidocaine with Epi 1:200 K  Assessment Sensory level: T10 Events: blood not aspirated, injection not painful, no injection resistance, negative IV test and no paresthesia  Additional Notes Pt. Evaluated and documentation done after procedure finished. Patient identified. Risks/Benefits/Options discussed with patient including but not limited to bleeding, infection, nerve damage, paralysis, failed block, incomplete pain control, headache, blood pressure changes, nausea, vomiting, reactions to medication both or allergic, itching and postpartum back pain. Confirmed with bedside nurse the patient's most recent platelet count. Confirmed with patient that they are not currently taking any anticoagulation, have any bleeding history or any family history of bleeding disorders. Patient expressed understanding and wished to proceed. All questions were answered. Sterile technique was used throughout the entire procedure. Please see nursing notes for vital signs. Test dose was given through epidural catheter and negative prior to continuing to dose epidural or start infusion. Warning signs of high block given to the  patient including shortness of breath, tingling/numbness in hands, complete motor block, or any concerning symptoms with instructions to call for help. Patient was given instructions on fall risk and not to get out of bed. All questions and concerns addressed with instructions to call with any issues or inadequate analgesia.   Patient tolerated the insertion well without complications.  Reason for block:procedure for pain

## 2016-09-17 NOTE — Progress Notes (Signed)
Fletcher AnonMackenzie Traub is a 26 y.o. G1P0 at 431w2d  Subjective: Doing well with epidural Pushing effectively since 18.30. Engaged in process and cheerful  Objective: BP 125/63 (BP Location: Left Arm)   Pulse (!) 109   Temp 98.5 F (36.9 C) (Oral)   Resp 14   Ht 5\' 7"  (1.702 m)   Wt 188 lb (85.3 kg)   LMP 11/04/2015   BMI 29.44 kg/m  I/O last 3 completed shifts: In: 2460.4 [I.V.:2260.4; IV Piggyback:200] Out: 1540 [Urine:1540] No intake/output data recorded.  FHT: Cat II strip with mod var, no accels, +variable decels with contractions UC:   regular, every 4 minutes SVE:   Dilation: 10 Effacement (%): 100 Station: +1 Exam by:: C Regions Financial CorporationJohnson RN  Labs: Lab Results  Component Value Date   WBC 14.1 (H) 09/16/2016   HGB 12.8 09/16/2016   HCT 36.6 09/16/2016   MCV 89.5 09/16/2016   PLT 190 09/16/2016    Assessment / Plan:  Anticipated MOD:  NSVD  [place] 09/17/2016, 8:12 PM

## 2016-09-17 NOTE — H&P (Signed)
OB ADMISSION/ HISTORY & PHYSICAL:  Admission Date: 09/16/2016 10:56 PM  Admit Diagnosis: Labor at term  Gabriela AnonMackenzie Fuller is a 26 y.o. G1P0 female at 6240+2wks presenting with contractions and found to be in first stage. Baby girl "Gabriela Fuller"  Prenatal History: G1P0   EDC : 09/15/2016, by Patient Reported  [facility] Prenatal course complicated by:  - Varicella non immune - GBS positive- no allergies  Prenatal Labs: ABO, Rh: --/--/O POS (01/17 2343) Antibody: NEG (01/17 2343) Rubella: Immune (06/22 0000)  RPR:    Negative HBsAg: Negative (06/22 0000)  HIV: Non-reactive (06/22 0000)  GTT: 62 GBS: Positive (12/20 0000)   Medical / Surgical History :  Past medical history:  Past Medical History:  Diagnosis Date  . Anxiety      Past surgical history:  Past Surgical History:  Procedure Laterality Date  . NO PAST SURGERIES      Family History: History reviewed. No pertinent family history.   Social History:  reports that she has never smoked. She has never used smokeless tobacco. She reports that she does not drink alcohol or use drugs.   Allergies: Patient has no known allergies.    Current Medications at time of admission:  Prior to Admission medications   Medication Sig Start Date End Date Taking? Authorizing Provider  Prenatal Vit-Fe Fumarate-FA (PRENATAL MULTIVITAMIN) TABS tablet Take 1 tablet by mouth daily at 12 noon.   Yes Historical Provider, MD     Review of Systems: Active FM onset of ctx @ 1500 currently every 2-4 minutes AROM for clear fluid at 0640   Physical Exam:  VS: Blood pressure 119/75, pulse 79, temperature 98 F (36.7 C), temperature source Oral, resp. rate 19, height 5\' 7"  (1.702 m), last menstrual period 11/04/2015.  General: alert and oriented, appears NAD Heart: RRR Lungs: Clear lung fields Abdomen: Gravid, soft and non-tender, non-distended Extremities: no edema  Genitalia / VE: Dilation: 5 Effacement (%):  80 Station: -2 Exam by:: ALogan Bores. Evans, RN  FHR: baseline rate 140 Min to mod/ accelerations none / non decelerations TOCO: q2-3  Last US 19 wks:  AF wnl, normal anatomy  Assessment: 40-+[redacted] weeks gestation first stage of labor FHR category 2   Plan:    ASSESSMENT AND PLAN:  Admit for active labor Labs pending Epidural when desired Continuous fetal monitoring   1. Fetal Well being  - Fetal Tracing: Cat I - Ultrasound:  reviewed, as above - Group B Streptococcus: positive- no allergies - Presentation: vtx confirmed by suture   2. Routine OB: - Prenatal labs reviewed, as above - Rh positive  3.  Labor:  -  Contractions external toco in place  4. Post Partum Planning: - Will need varicella vaccine postpartum     Cline CoolsBethany E. Langley Ingalls, MD MPH Attending physician [facility]

## 2016-09-17 NOTE — Progress Notes (Signed)
Patient request to be intermittent monitoring. Dalbert GarnetBeasley, MD verbally ordered that patient may be off the monitor with a fetal tracing 15 minutes every hour as long as baby is not having decelerations. Patient educated on the need for fetal tracing and verbalized understanding. Husband and family at bedside. Will continue to monitor.

## 2016-09-17 NOTE — Progress Notes (Signed)
Pt complaining of feeling contractions on the left side. Pt repositioned to L side lying for 15 minutes with no relief. Notified anesthesia and instructed to increase rate to 20 mL/hr for 1 hour and then decrease to 10 mL/hr.

## 2016-09-17 NOTE — Progress Notes (Signed)
Gabriela Fuller is a 26 y.o. G1P0 at 4574w2d in active labor, slow labor course.  Subjective: Comfortable with epidural, still cheerful  Objective: BP 111/65 (BP Location: Left Arm)   Pulse 99   Temp 98.4 F (36.9 C) (Oral)   Resp 16   Ht 5\' 7"  (1.702 m)   Wt 188 lb (85.3 kg)   LMP 11/04/2015   BMI 29.44 kg/m  I/O last 3 completed shifts: In: 435.4 [I.V.:385.4; IV Piggyback:50] Out: -  Total I/O In: -  Out: 1540 [Urine:1540]  FHT:  FHR: 140 bpm, variability: moderate,  accelerations:  Present,  decelerations:  Absent UC:   regular, every 4 minutes SVE:   Dilation: Lip/rim Effacement (%): 80, 90 Station: +1 Exam by:: Dalbert GarnetBeasley MD  Labs: Lab Results  Component Value Date   WBC 14.1 (H) 09/16/2016   HGB 12.8 09/16/2016   HCT 36.6 09/16/2016   MCV 89.5 09/16/2016   PLT 190 09/16/2016    Assessment / Plan: Spontaneous labor, progressing normally  Labor: Slow first stage. On exam, no caput or molding, fetal well being is reassuring. Anterior lip that is not reducible with pushing. Plenty of posterior room, pubic arch not small, fetal size normal. Contractions q4 min. Recommend nipple stim, offered pitocin but would like to try nipple stim first. Exaggerated sims, high Fowlers and Gaskin positioning rotation started. Fetal Wellbeing:  Category I I/D:  GBS + on amp, afebrile, fluid clear Anticipated MOD:  NSVD  Kwane Rohl 09/17/2016, 3:57 PM

## 2016-09-18 LAB — CBC
HCT: 32.4 % — ABNORMAL LOW (ref 35.0–47.0)
Hemoglobin: 11 g/dL — ABNORMAL LOW (ref 12.0–16.0)
MCH: 31.1 pg (ref 26.0–34.0)
MCHC: 34 g/dL (ref 32.0–36.0)
MCV: 91.3 fL (ref 80.0–100.0)
PLATELETS: 142 10*3/uL — AB (ref 150–440)
RBC: 3.55 MIL/uL — ABNORMAL LOW (ref 3.80–5.20)
RDW: 13.6 % (ref 11.5–14.5)
WBC: 16 10*3/uL — AB (ref 3.6–11.0)

## 2016-09-18 LAB — RPR: RPR: NONREACTIVE

## 2016-09-18 MED ORDER — BENZOCAINE-MENTHOL 20-0.5 % EX AERO
1.0000 "application " | INHALATION_SPRAY | CUTANEOUS | Status: DC | PRN
  Administered 2016-09-18: 1 via TOPICAL
  Filled 2016-09-18 (×2): qty 56

## 2016-09-18 MED ORDER — IBUPROFEN 600 MG PO TABS
600.0000 mg | ORAL_TABLET | Freq: Four times a day (QID) | ORAL | Status: DC
  Administered 2016-09-18 – 2016-09-19 (×7): 600 mg via ORAL
  Filled 2016-09-18 (×9): qty 1

## 2016-09-18 MED ORDER — ONDANSETRON HCL 4 MG PO TABS: 4.0000 mg | ORAL_TABLET | ORAL | Status: DC | PRN

## 2016-09-18 MED ORDER — SENNOSIDES-DOCUSATE SODIUM 8.6-50 MG PO TABS
2.0000 | ORAL_TABLET | ORAL | Status: DC
  Administered 2016-09-18 – 2016-09-19 (×2): 2 via ORAL
  Filled 2016-09-18 (×2): qty 2

## 2016-09-18 MED ORDER — ONDANSETRON HCL 4 MG/2ML IJ SOLN: 4.0000 mg | INTRAMUSCULAR | Status: DC | PRN

## 2016-09-18 MED ORDER — OXYCODONE-ACETAMINOPHEN 5-325 MG PO TABS
1.0000 | ORAL_TABLET | Freq: Four times a day (QID) | ORAL | Status: DC | PRN
  Administered 2016-09-18: 1 via ORAL
  Administered 2016-09-18 – 2016-09-19 (×4): 2 via ORAL
  Filled 2016-09-18 (×3): qty 2
  Filled 2016-09-18: qty 1
  Filled 2016-09-18: qty 2

## 2016-09-18 MED ORDER — BISACODYL 10 MG RE SUPP: 10.0000 mg | Freq: Every day | RECTAL | Status: DC | PRN

## 2016-09-18 MED ORDER — SODIUM CHLORIDE 0.9% FLUSH: 3.0000 mL | Freq: Two times a day (BID) | INTRAVENOUS | Status: DC

## 2016-09-18 MED ORDER — WITCH HAZEL-GLYCERIN EX PADS: 1.0000 "application " | MEDICATED_PAD | CUTANEOUS | Status: DC | PRN

## 2016-09-18 MED ORDER — ZOLPIDEM TARTRATE 5 MG PO TABS: 5.0000 mg | ORAL_TABLET | Freq: Every evening | ORAL | Status: DC | PRN

## 2016-09-18 MED ORDER — VARICELLA VIRUS VACCINE LIVE 1350 PFU/0.5ML IJ SUSR: 0.5000 mL | INTRAMUSCULAR | Status: DC | PRN

## 2016-09-18 MED ORDER — TETANUS-DIPHTH-ACELL PERTUSSIS 5-2.5-18.5 LF-MCG/0.5 IM SUSP
0.5000 mL | Freq: Once | INTRAMUSCULAR | Status: DC
Start: 2016-09-18 — End: 2016-09-18

## 2016-09-18 MED ORDER — FLEET ENEMA 7-19 GM/118ML RE ENEM: 1.0000 | ENEMA | Freq: Every day | RECTAL | Status: DC | PRN

## 2016-09-18 MED ORDER — SODIUM CHLORIDE 0.9 % IV SOLN: 250.0000 mL | INTRAVENOUS | Status: DC | PRN

## 2016-09-18 MED ORDER — MEASLES, MUMPS & RUBELLA VAC ~~LOC~~ INJ: 0.5000 mL | INJECTION | Freq: Once | SUBCUTANEOUS | Status: DC

## 2016-09-18 MED ORDER — SIMETHICONE 80 MG PO CHEW: 80.0000 mg | CHEWABLE_TABLET | ORAL | Status: DC | PRN

## 2016-09-18 MED ORDER — DIBUCAINE 1 % RE OINT: 1.0000 "application " | TOPICAL_OINTMENT | RECTAL | Status: DC | PRN

## 2016-09-18 MED ORDER — DIPHENHYDRAMINE HCL 25 MG PO CAPS: 25.0000 mg | ORAL_CAPSULE | Freq: Four times a day (QID) | ORAL | Status: DC | PRN

## 2016-09-18 MED ORDER — SODIUM CHLORIDE 0.9% FLUSH: 3.0000 mL | INTRAVENOUS | Status: DC | PRN

## 2016-09-18 MED ORDER — PRENATAL MULTIVITAMIN CH
1.0000 | ORAL_TABLET | Freq: Every day | ORAL | Status: DC
  Administered 2016-09-18 – 2016-09-19 (×2): 1 via ORAL
  Filled 2016-09-18 (×2): qty 1

## 2016-09-18 MED ORDER — COCONUT OIL OIL
1.0000 "application " | TOPICAL_OIL | Status: DC | PRN
  Administered 2016-09-18: 1 via TOPICAL
  Filled 2016-09-18: qty 120

## 2016-09-18 NOTE — Anesthesia Postprocedure Evaluation (Signed)
Anesthesia Post Note  Patient: Gabriela Fuller  Procedure(s) Performed: * No procedures listed *  Patient location during evaluation: Mother Baby Anesthesia Type: Epidural Level of consciousness: awake and alert Pain management: pain level controlled Vital Signs Assessment: post-procedure vital signs reviewed and stable Respiratory status: spontaneous breathing, nonlabored ventilation and respiratory function stable Cardiovascular status: stable Postop Assessment: no headache, no backache and patient able to bend at knees Anesthetic complications: no     Last Vitals:  Vitals:   09/18/16 0100 09/18/16 0404  BP: 106/68 118/66  Pulse: 84 88  Resp: 18 18  Temp: 36.9 C 36.4 C    Last Pain:  Vitals:   09/18/16 0540  TempSrc:   PainSc: 4                  Cleda MccreedyJoseph K Meril Dray

## 2016-09-18 NOTE — Progress Notes (Signed)
Peri Pain controlled by Percocet & Motrin , Breastfeeding well , Next shift report given to Jillyn HiddenE Mathewson RN .

## 2016-09-18 NOTE — Progress Notes (Signed)
PPD #1, SVD, baby girl "Josie"   S:  Reports feeling good, but "sore"             Tolerating po/ No nausea or vomiting             Bleeding is light             Pain somewhat controlled with Motrin, but states she would like something stronger for pain             Up ad lib / ambulatory / voiding QS  Newborn breast feeding - reports she has not fed well since midnight, having difficulty with latching   O:               VS: BP 118/64 (BP Location: Left Arm)   Pulse 72   Temp 97.8 F (36.6 C) (Oral)   Resp 18   Ht 5\' 7"  (1.702 m)   Wt 85.3 kg (188 lb)   LMP 11/04/2015   SpO2 99%   Breastfeeding? Unknown   BMI 29.44 kg/m    LABS:              Recent Labs  09/16/16 2343 09/18/16 0613  WBC 14.1* 16.0*  HGB 12.8 11.0*  PLT 190 142*               Blood type: --/--/O POS (01/17 2343)  Rubella: Immune (06/22 0000)                     I&O: Intake/Output      01/18 0701 - 01/19 0700 01/19 0701 - 01/20 0700   I.V. (mL/kg) 1875 (22)    IV Piggyback 150    Total Intake(mL/kg) 2025 (23.7)    Urine (mL/kg/hr) 2365 (1.2)    Emesis/NG output 0 (0)    Total Output 2365     Net -340          Urine Occurrence 1 x    Emesis Occurrence 1 x                  Physical Exam:             Alert and oriented X3  Lungs: Clear and unlabored  Heart: regular rate and rhythm / no mumurs  Abdomen: soft, non-tender, non-distended              Fundus: firm, non-tender, U-1  Perineum: well approximated periurethral and vaginal laceration healing well - no significant erythema or edema  Lochia: appropriate, no clots   Extremities: non-pitting BLE edema, no calf pain or tenderness    A: PPD # 1  Varicella non-immune   Doing well - stable status  GBS Positive - adequately treated   P: Routine post partum orders  Varivax prior to discharge  See Lactation today   Percocet 5-325mg  1-2 tablets every 6 hours PRN for severe pain   Plan for discharge home tomorrow   Carlean JewsMeredith Sigmon, CNM

## 2016-09-19 LAB — URINALYSIS, COMPLETE (UACMP) WITH MICROSCOPIC
Bacteria, UA: NONE SEEN
Bilirubin Urine: NEGATIVE
Glucose, UA: NEGATIVE mg/dL
KETONES UR: NEGATIVE mg/dL
Leukocytes, UA: NEGATIVE
Nitrite: NEGATIVE
PH: 6 (ref 5.0–8.0)
PROTEIN: NEGATIVE mg/dL
Specific Gravity, Urine: 1.017 (ref 1.005–1.030)

## 2016-09-19 MED ORDER — OXYCODONE-ACETAMINOPHEN 5-325 MG PO TABS: 1.0000 | ORAL_TABLET | Freq: Four times a day (QID) | ORAL | 0 refills | Status: DC | PRN

## 2016-09-19 MED ORDER — IBUPROFEN 600 MG PO TABS: 600.0000 mg | ORAL_TABLET | Freq: Four times a day (QID) | ORAL | 0 refills | Status: DC

## 2016-09-19 NOTE — Discharge Instructions (Signed)
Follow up sooner with fever, problems breathing, pain not helped by medications, severe depression( more than just baby blues, wanting to hurt yourself or the baby), severe bleeding ( saturating more than one pad an hour or large palm sized clots), no heavy lifting , no driving while taking narcotics, no douches, intercourse, tampons or enemas for 6 weeks Care After Vaginal Delivery Congratulations on your new baby!!  Refer to this sheet in the next few weeks. These discharge instructions provide you with information on caring for yourself after delivery. Your caregiver may also give you specific instructions. Your treatment has been planned according to the most current medical practices available, but problems sometimes occur. Call your caregiver if you have any problems or questions after you go home.  HOME CARE INSTRUCTIONS  Take over-the-counter or prescription medicines only as directed by your caregiver or pharmacist.  Do not drink alcohol, especially if you are breastfeeding or taking medicine to relieve pain.  Do not chew or smoke tobacco.  Do not use illegal drugs.  Continue to use good perineal care. Good perineal care includes:  Wiping your perineum from front to back.  Keeping your perineum clean.  Do not use tampons or douche until your caregiver says it is okay.  Shower, wash your hair, and take tub baths as directed by your caregiver.  Wear a well-fitting bra that provides breast support.  Eat healthy foods.  Drink enough fluids to keep your urine clear or pale yellow.  Eat high-fiber foods such as whole grain cereals and breads, brown rice, beans, and fresh fruits and vegetables every day. These foods may help prevent or relieve constipation.  Follow your caregiver's recommendations regarding resumption of activities such as climbing stairs, driving, lifting, exercising, or traveling. Specifically, no driving for two weeks, so that you are comfortable reacting  quickly in an emergency.  Talk to your caregiver about resuming sexual activities. Resumption of sexual activities is dependent upon your risk of infection, your rate of healing, and your comfort and desire to resume sexual activity. Usually we recommend waiting about six weeks, or until your bleeding stops and you are interested in sex.  Try to have someone help you with your household activities and your newborn for at least a few days after you leave the hospital. Even longer is better.  Rest as much as possible. Try to rest or take a nap when your newborn is sleeping. Sleep deprivation can be very hard after delivery.  Increase your activities gradually.  Keep all of your scheduled postpartum appointments. It is very important to keep your scheduled follow-up appointments. At these appointments, your caregiver will be checking to make sure that you are healing physically and emotionally.  SEEK MEDICAL CARE IF:   You are passing large clots from your vagina.   You have a foul smelling discharge from your vagina.  You have trouble urinating.  You are urinating frequently.  You have pain when you urinate.  You have a change in your bowel movements.  You have increasing redness, pain, or swelling near your vaginal incision (episiotomy) or vaginal tear.  You have pus draining from your episiotomy or vaginal tear.  Your episiotomy or vaginal tear is separating.  You have painful, hard, or reddened breasts.  You have a severe headache.  You have blurred vision or see spots.  You feel sad or depressed.  You have thoughts of hurting yourself or your newborn.  You have questions about your care, the care of  your newborn, or medicines. °· You are dizzy or light-headed. °· You have a rash. °· You have nausea or vomiting. °· You were breastfeeding and have not had a menstrual period within 12 weeks after you stopped breastfeeding. °· You are not breastfeeding and have not had a  menstrual period by the 12th week after delivery. °· You have a fever. ° °SEEK IMMEDIATE MEDICAL CARE IF:  °· You have persistent pain. °· You have chest pain. °· You have shortness of breath. °· You faint. °· You have leg pain. °· You have stomach pain. °· Your vaginal bleeding saturates two or more sanitary pads in 1 hour. ° °MAKE SURE YOU:  °· Understand these instructions. °· Will get help right away if you are not doing well or get worse. °·  °Document Released: 08/14/2000 Document Revised: 01/01/2014 Document Reviewed: 04/13/2012 ° °ExitCare® Patient Information ©2015 [employer]. This information is not intended to replace advice given to you by your health care provider. Make sure you discuss any questions you have with your health care provider. ° °

## 2016-09-19 NOTE — Progress Notes (Signed)
All discharge instructions given to patient and she voices understanding of all instructions given. She will make her own f/u appt. Prescription given to patient . Patient discharged home with infant and spouse escorted out by [employer]

## 2016-09-19 NOTE — Discharge Summary (Signed)
Obstetrical Discharge Summary  Patient Name: Gabriela Fuller DOB: [birth year] MRN: [id number]  Date of Admission: 09/16/2016 Date of Discharge: 09/19/2016  Primary OB: [facility]   Gestational Age at Delivery: [redacted]w[redacted]d   Antepartum complications: - Varicella non immune - GBS positive- no allergies Admitting Diagnosis: Labor at 40+2 weeks Secondary Diagnosis: Patient Active Problem List   Diagnosis Date Noted  . Pregnancy 09/16/2016  . Abdominal pain affecting pregnancy 09/02/2016  . Abdominal pain during pregnancy in third trimester 09/02/2016  . Family history of developmental disability 03/09/2016    Augmentation: AROM Complications: None Intrapartum complications/course: 25yo G1P0 at 40+2wks presented in active labor. AROM for clear fluid. No pitocin required. Pushed over an intact perineum. Fetal head delivered followed immediately by anterior shoulder. Vigorous crying and baby placed on maternal abdomen. Placenta delivered spontaneously and intact. Right periurethral and left hymenal tear repaired for hemostasis. Fundus firm, postpartum pit given.  Date of Delivery: 09/17/16 Delivered By: Christeen Douglas Delivery Type: spontaneous vaginal delivery Anesthesia: epidural Placenta: sponatneous Laceration: periurethral, vaginal  Episiotomy: none Newborn Data: Live born female  Birth Weight: 8 lb 1.1 oz (3660 g) APGAR: 8, 9   Postpartum Procedures: Varivax PP  Post partum course: 09/19/16 Patient had an uncomplicated postpartum course.  By time of discharge on PPD#2, her pain was controlled on oral pain medications; she had appropriate lochia and was ambulating, voiding without difficulty and tolerating regular diet.  She was deemed stable for discharge to home.    Discharge Physical Exam: 09/19/16 BP 117/73 (BP Location: Left Arm)   Pulse 60   Temp 97.5 F (36.4 C) (Oral)   Resp 18   Ht 5\' 7"  (1.702 m)   Wt 85.3 kg (188 lb)   LMP 11/04/2015   SpO2 100%    Breastfeeding? Unknown   BMI 29.44 kg/m   General: NAD CV: RRR Pulm: CTABL, nl effort ABD: s/nd/nt, fundus firm and below the umbilicus Lochia: moderate Perineum: well approximated periurethral and vaginal laceration, no significant edema or erythema  Left-sided back pain - musculoskeletal vs. CVAT, +CVAT on exam  DVT Evaluation: LE non-ttp, no evidence of DVT on exam.  Hemoglobin  Date Value Ref Range Status  09/18/2016 11.0 (L) 12.0 - 16.0 g/dL Final   HGB  Date Value Ref Range Status  09/25/2014 12.8 12.0 - 16.0 g/dL Final   HCT  Date Value Ref Range Status  09/18/2016 32.4 (L) 35.0 - 47.0 % Final  09/25/2014 38.8 35.0 - 47.0 % Final     Disposition: stable, discharge to home. Baby Feeding: breastmilk Baby Disposition: home with mom  Rh Immune globulin given: N/A Rubella vaccine given: N/A Tdap vaccine given in AP or PP setting: UTD Flu vaccine given in AP or PP setting: UTD  Contraception: Ortho-Evra Patch, open to POP while breastfeeding   Prenatal Labs:  Prenatal Labs: ABO, Rh: --/--/O POS (01/17 2343) Antibody: NEG (01/17 2343) Rubella: Immune (06/22 0000)  RPR:    Negative HBsAg: Negative (06/22 0000)  HIV: Non-reactive (06/22 0000)  GTT: 62 GBS: Positive (12/20 0000)    Plan:  Gabriela Fuller was discharged to home in good condition. [facility] at [facility] in 6 weeks   Discharge Medications: Allergies as of 09/19/2016   No Known Allergies     Medication List    TAKE these medications   ibuprofen 600 MG tablet Commonly known as:  ADVIL,MOTRIN Take 1 tablet (600 mg total) by mouth every 6 (six) hours.   oxyCODONE-acetaminophen 5-325  MG tablet Commonly known as:  PERCOCET/ROXICET Take 1-2 tablets by mouth every 6 (six) hours as needed for severe pain.   prenatal multivitamin Tabs tablet Take 1 tablet by mouth daily at 12 noon.       Follow-up Information    Christeen DouglasBEASLEY, BETHANY, MD. Call in 6 week(s).    Specialty:  Obstetrics and Gynecology Why:  Postpartum visit  Contact information: [address] [place] [id number] [phone]         - Urinalysis and urine culture prior to discharge - discussed doing a straight cath for urinalysis and patient declines at this time. She would like to try a regular clean catch.   Signed:  Carlean JewsMeredith Sigmon, CNM

## 2016-09-20 LAB — URINE CULTURE

## 2016-09-27 ENCOUNTER — Emergency Department
Admission: EM | Admit: 2016-09-27 | Discharge: 2016-09-27 | Disposition: A | Discharge status: Home / Self Care | Payer: 59 | Attending: Emergency Medicine | Admitting: Emergency Medicine

## 2016-09-27 ENCOUNTER — Emergency Department: Payer: 59

## 2016-09-27 ENCOUNTER — Encounter: Payer: Self-pay | Admitting: Emergency Medicine

## 2016-09-27 DIAGNOSIS — R109 Unspecified abdominal pain: Secondary | ICD-10-CM

## 2016-09-27 DIAGNOSIS — N719 Inflammatory disease of uterus, unspecified: Secondary | ICD-10-CM | POA: Insufficient documentation

## 2016-09-27 DIAGNOSIS — O9989 Other specified diseases and conditions complicating pregnancy, childbirth and the puerperium: Secondary | ICD-10-CM | POA: Diagnosis present

## 2016-09-27 DIAGNOSIS — O8612 Endometritis following delivery: Secondary | ICD-10-CM

## 2016-09-27 LAB — CHLAMYDIA/NGC RT PCR (ARMC ONLY)
CHLAMYDIA TR: NOT DETECTED
N gonorrhoeae: NOT DETECTED

## 2016-09-27 LAB — URINALYSIS, COMPLETE (UACMP) WITH MICROSCOPIC
BACTERIA UA: NONE SEEN
BILIRUBIN URINE: NEGATIVE
Glucose, UA: NEGATIVE mg/dL
KETONES UR: NEGATIVE mg/dL
NITRITE: NEGATIVE
PH: 6 (ref 5.0–8.0)
PROTEIN: NEGATIVE mg/dL
RBC / HPF: NONE SEEN RBC/hpf (ref 0–5)
Specific Gravity, Urine: 1.001 — ABNORMAL LOW (ref 1.005–1.030)

## 2016-09-27 LAB — WET PREP, GENITAL
CLUE CELLS WET PREP: NONE SEEN
Sperm: NONE SEEN
TRICH WET PREP: NONE SEEN
Yeast Wet Prep HPF POC: NONE SEEN

## 2016-09-27 LAB — COMPREHENSIVE METABOLIC PANEL
ALT: 18 U/L (ref 14–54)
ANION GAP: 8 (ref 5–15)
AST: 18 U/L (ref 15–41)
Albumin: 4 g/dL (ref 3.5–5.0)
Alkaline Phosphatase: 94 U/L (ref 38–126)
BILIRUBIN TOTAL: 0.7 mg/dL (ref 0.3–1.2)
BUN: 15 mg/dL (ref 6–20)
CHLORIDE: 104 mmol/L (ref 101–111)
CO2: 23 mmol/L (ref 22–32)
Calcium: 9.1 mg/dL (ref 8.9–10.3)
Creatinine, Ser: 0.93 mg/dL (ref 0.44–1.00)
GFR calc Af Amer: >60 mL/min (ref >60)
Glucose, Bld: 102 mg/dL — ABNORMAL HIGH (ref 65–99)
POTASSIUM: 3.7 mmol/L (ref 3.5–5.1)
Sodium: 135 mmol/L (ref 135–145)
TOTAL PROTEIN: 8.1 g/dL (ref 6.5–8.1)

## 2016-09-27 LAB — CBC
HEMATOCRIT: 36.7 % (ref 35.0–47.0)
Hemoglobin: 12.5 g/dL (ref 12.0–16.0)
MCH: 30.4 pg (ref 26.0–34.0)
MCHC: 33.9 g/dL (ref 32.0–36.0)
MCV: 89.5 fL (ref 80.0–100.0)
Platelets: 257 10*3/uL (ref 150–440)
RBC: 4.1 MIL/uL (ref 3.80–5.20)
RDW: 13.1 % (ref 11.5–14.5)
WBC: 13.3 10*3/uL — AB (ref 3.6–11.0)

## 2016-09-27 MED ORDER — IBUPROFEN 800 MG PO TABS: 800.0000 mg | ORAL_TABLET | Freq: Three times a day (TID) | ORAL | 0 refills | Status: DC | PRN

## 2016-09-27 MED ORDER — OXYCODONE HCL 5 MG PO TABS: 5.0000 mg | ORAL_TABLET | Freq: Three times a day (TID) | ORAL | 0 refills | Status: AC | PRN

## 2016-09-27 MED ORDER — KETOROLAC TROMETHAMINE 30 MG/ML IJ SOLN
30.0000 mg | Freq: Once | INTRAMUSCULAR | Status: AC
  Administered 2016-09-27: 30 mg via INTRAVENOUS
  Filled 2016-09-27: qty 1

## 2016-09-27 MED ORDER — SODIUM CHLORIDE 0.9 % IV SOLN
3.0000 g | Freq: Once | INTRAVENOUS | Status: AC
  Administered 2016-09-27: 3 g via INTRAVENOUS
  Filled 2016-09-27: qty 3

## 2016-09-27 MED ORDER — SODIUM CHLORIDE 0.9 % IV BOLUS (SEPSIS)
1000.0000 mL | Freq: Once | INTRAVENOUS | Status: AC
  Administered 2016-09-27: 1000 mL via INTRAVENOUS

## 2016-09-27 MED ORDER — AMOXICILLIN-POT CLAVULANATE 875-125 MG PO TABS: 1.0000 | ORAL_TABLET | Freq: Two times a day (BID) | ORAL | 0 refills | Status: AC

## 2016-09-27 NOTE — Discharge Instructions (Signed)
Return to the emergency room if you have worsening abdominal pain, nausea vomiting, fever after 24 hours of antibiotics, dizziness, or any other symptoms concerning to you. Take the antibiotics fully as prescribed. Call your OB/GYN tomorrow for close follow-up.

## 2016-09-27 NOTE — ED Provider Notes (Signed)
[facility] Emergency Department Provider Note  ____________________________________________  Time seen: Approximately 4:07 PM  I have reviewed the triage vital signs and the nursing notes.   HISTORY  Chief Complaint Postpartum Complications   HPI Gabriela Fuller is a 26 y.o. female G1 P1 postpartum date 10 of a normal vaginal delivery who presents for evaluation of fever and abdominal pain. Patient reports that her vaginal bleeding was markedly improved and restarted 2 days ago. At the same time she started to have fever as high as 101F, lower cramping abdominal pain and vaginal pressure. Today was the second day of fever. She is currently endorses moderate cramping lower pelvic pain and abdominal pain nonradiating and constant. She has been breast-feeding with no problems. No flulike illness, no sore throat, congestion, cough, chest pain, shortness of breath, vomiting, diarrhea. She has had dysuria since the delivery as she received stitches in her vagina.  Past Medical History:  Diagnosis Date  . Anxiety     Patient Active Problem List   Diagnosis Date Noted  . Pregnancy 09/16/2016  . Abdominal pain affecting pregnancy 09/02/2016  . Abdominal pain during pregnancy in third trimester 09/02/2016  . Family history of developmental disability 03/09/2016    Past Surgical History:  Procedure Laterality Date  . NO PAST SURGERIES      Prior to Admission medications   Medication Sig Start Date End Date Taking? Authorizing Provider  amoxicillin-clavulanate (AUGMENTIN) 875-125 MG tablet Take 1 tablet by mouth 2 (two) times daily. 09/27/16 10/07/16  Nita Sicklearolina Kartier Bennison, MD  ibuprofen (ADVIL,MOTRIN) 800 MG tablet Take 1 tablet (800 mg total) by mouth every 8 (eight) hours as needed. 09/27/16   Nita Sicklearolina Jimmie Rueter, MD  oxyCODONE (ROXICODONE) 5 MG immediate release tablet Take 1 tablet (5 mg total) by mouth every 8 (eight) hours as needed. 09/27/16 09/27/17  Nita Sicklearolina  Trayven Lumadue, MD  oxyCODONE-acetaminophen (PERCOCET/ROXICET) 5-325 MG tablet Take 1-2 tablets by mouth every 6 (six) hours as needed for severe pain. 09/19/16   Karena AddisonMeredith C Sigmon, CNM  Prenatal Vit-Fe Fumarate-FA (PRENATAL MULTIVITAMIN) TABS tablet Take 1 tablet by mouth daily at 12 noon.    Historical Provider, MD    Allergies Patient has no known allergies.  No family history on file.  Social History Social History  Substance Use Topics  . Smoking status: Never Smoker  . Smokeless tobacco: Never Used  . Alcohol use No    Review of Systems  Constitutional: Negative for fever. Eyes: Negative for visual changes. ENT: Negative for sore throat. Neck: No neck pain  Cardiovascular: Negative for chest pain. Respiratory: Negative for shortness of breath. Gastrointestinal: + lower abdominal pain. No vomiting or diarrhea. Genitourinary: + dysuria and vaginal bleeding Musculoskeletal: Negative for back pain. Skin: Negative for rash. Neurological: Negative for headaches, weakness or numbness. Psych: No SI or HI  ____________________________________________   PHYSICAL EXAM:  VITAL SIGNS: ED Triage Vitals  Enc Vitals Group     BP 09/27/16 1426 102/75     Pulse Rate 09/27/16 1426 93     Resp 09/27/16 1426 16     Temp 09/27/16 1426 99.1 F (37.3 C)     Temp Source 09/27/16 1426 Oral     SpO2 09/27/16 1426 97 %     Weight 09/27/16 1426 160 lb (72.6 kg)     Height 09/27/16 1426 5\' 7"  (1.702 m)     Head Circumference --      Peak Flow --      Pain Score 09/27/16  1427 4     Pain Loc --      Pain Edu? --      Excl. in GC? --     Constitutional: Alert and oriented. Well appearing and in no apparent distress. HEENT:      Head: Normocephalic and atraumatic.         Eyes: Conjunctivae are normal. Sclera is non-icteric. EOMI. PERRL      Mouth/Throat: Mucous membranes are moist.       Neck: Supple with no signs of meningismus. Breast: No evidence of mastitis Cardiovascular: Regular  rate and rhythm. No murmurs, gallops, or rubs. 2+ symmetrical distal pulses are present in all extremities. No JVD. Respiratory: Normal respiratory effort. Lungs are clear to auscultation bilaterally. No wheezes, crackles, or rhonchi.  Gastrointestinal: Soft, ttp over the suprapubic and periumbilical regions, and non distended with positive bowel sounds. No rebound or guarding. Genitourinary: No CVA tenderness. Pelvic exam: Normal external genitalia, no rashes or lesions. Small amount of blood in the vault. Os closed. No cervical motion tenderness.  No uterine or adnexal tenderness.   Musculoskeletal: Nontender with normal range of motion in all extremities. No edema, cyanosis, or erythema of extremities. Neurologic: Normal speech and language. Face is symmetric. Moving all extremities. No gross focal neurologic deficits are appreciated. Skin: Skin is warm, dry and intact. No rash noted. Psychiatric: Mood and affect are normal. Speech and behavior are normal.  ____________________________________________   LABS (all labs ordered are listed, but only abnormal results are displayed)  Labs Reviewed  WET PREP, GENITAL - Abnormal; Notable for the following:       Result Value   WBC, Wet Prep HPF POC FEW (*)    All other components within normal limits  COMPREHENSIVE METABOLIC PANEL - Abnormal; Notable for the following:    Glucose, Bld 102 (*)    All other components within normal limits  CBC - Abnormal; Notable for the following:    WBC 13.3 (*)    All other components within normal limits  URINALYSIS, COMPLETE (UACMP) WITH MICROSCOPIC - Abnormal; Notable for the following:    Color, Urine STRAW (*)    APPearance CLEAR (*)    Specific Gravity, Urine 1.001 (*)    Hgb urine dipstick LARGE (*)    Leukocytes, UA LARGE (*)    Squamous Epithelial / LPF 0-5 (*)    All other components within normal limits  CHLAMYDIA/NGC RT PCR ([facility] ONLY)  URINE CULTURE    ____________________________________________  EKG  none ____________________________________________  RADIOLOGY  Pelvic US:  Widened endometrial canal with diffuse hypoechoic material within likely representing thrombus. No increased vascularity to suggest retained products of conception is seen.  The uterus is enlarged consistent with postpartum state. ____________________________________________   PROCEDURES  Procedure(s) performed: None Procedures Critical Care performed:  None ____________________________________________   INITIAL IMPRESSION / ASSESSMENT AND PLAN / ED COURSE  26 y.o. female G1 P1 postpartum date 10 of a normal vaginal delivery who presents for evaluation of fever and abdominal pain and worsening vaginal bleeding. Patient is well-appearing, in no distress, vital signs show low-grade temp of 99.67F, otherwise no acute findings, she has mild suprapubic tenderness. Differential diagnoses including urinary tract infection versus endometritis versus retained products of conception. We'll do a pelvic exam, transvaginal ultrasound, basic blood work and UA.  Clinical Course as of Sep 27 1932  Wynelle Link Sep 27, 2016  1929 I discussed patient's presentation, pelvic exam, lab work, vital signs, and results of pelvic ultrasound with  Dr. Dalbert Garnet who was patient's OB/GYN and she recommended giving a dose of IV Unasyn and discharged home on Augmentin twice a day for possible endometritis. She will see patient in 24 hours to 48 hours for recheck. I discussed return precautions with the patient for any signs of worsening infection including fever after 24 hours of antibiotics, dizziness, nausea vomiting, worsening pain and recommended if she returns to the emergency room if these develop. I'll also give patient ibuprofen 800 mg and oxycodone for her pain.  [CV]    Clinical Course User Index [CV] Nita Sickle, MD    Pertinent labs & imaging results that were available during  my care of the patient were reviewed by me and considered in my medical decision making (see chart for details).    ____________________________________________   FINAL CLINICAL IMPRESSION(S) / ED DIAGNOSES  Final diagnoses:  Abdominal pain  Endometritis following delivery      NEW MEDICATIONS STARTED DURING THIS VISIT:  New Prescriptions   AMOXICILLIN-CLAVULANATE (AUGMENTIN) 875-125 MG TABLET    Take 1 tablet by mouth 2 (two) times daily.   IBUPROFEN (ADVIL,MOTRIN) 800 MG TABLET    Take 1 tablet (800 mg total) by mouth every 8 (eight) hours as needed.   OXYCODONE (ROXICODONE) 5 MG IMMEDIATE RELEASE TABLET    Take 1 tablet (5 mg total) by mouth every 8 (eight) hours as needed.     Note:  This document was prepared using Dragon voice recognition software and may include unintentional dictation errors.    Nita Sickle, MD 09/27/16 [phone]

## 2016-09-27 NOTE — ED Notes (Signed)
Pt given water, per MD to see if she can produce a urine sample.

## 2016-09-27 NOTE — ED Triage Notes (Signed)
Pt comes into the ED via POv c/o post partum complications which have included a fever and heavier bleeding.  Patient denies any abdominal pain other than typical post partum discomfort.  Patient states she is having sharp pains in her vagina but she is unsure of if the pain is located at her tearing site.  Patient states she has been taking tylenol and the fever has been responding well to it.  Last dose of tylenol was at 11:00. Patient states she is still wearing the post partum "pull ups" and she is having to change it more often than normal.

## 2016-09-27 NOTE — ED Notes (Signed)
Pt is drinking 32 oz of water for ultrasound

## 2016-09-27 NOTE — ED Notes (Signed)
Patient transported to Ultrasound 

## 2016-09-29 ENCOUNTER — Telehealth: Payer: Self-pay | Admitting: Emergency Medicine

## 2016-09-29 LAB — URINE CULTURE

## 2016-09-29 NOTE — Telephone Encounter (Signed)
Charge nurse received critical value group b strep on urine culture.  Called [facility] and patient has already been seen by dr Dalbert Garnetbeasley today.  They will give dr beasely  new result to review.

## 2017-08-31 NOTE — L&D Delivery Note (Signed)
Delivery Note At 6:22 AM a viable and healthy female "Gabriela Fuller" was delivered via Vaginal, Spontaneous (Presentation: ROA, compounding arm).  APGAR: 8, 9; weight pending .   Placenta status: spontaneous.  Cord:  with the following complications: none.  Cord pH: 7.17  Anesthesia:  Epidural, but first leaked onto the floor and a bolus was given in the second stage Episiotomy:  no Lacerations:  none Suture Repair: n/a Est. Blood Loss (mL):  450  Mom to postpartum.  Baby to Couplet care / Skin to Skin.  26yo [id number] at 38+1wks admitted with pre-labor intrauterine infection. GBS positive, good fetal movement, but fetal and maternal tachycardia on admission that improved with abx and acetominophen. She was admitted with a cervix of 4cm and contracting q3-4 min, temp at home of 102.9 and improved to 101.5 with tylenol. She was started on amp/gent on admission, and AROM for clear fluid 4 hrs later. Her initial WBC was 26 and on repeat 6 hrs later was 16. She received an epidural, but a hole in the bag drained the full 1.5% lidocaine with Epi 1:200 K onto the floor. She received a second bolus when she was pushing in the second stage with good control. She pushed in all positions and delivered over an intact perineum with minimal pain control. She did well. Baby was placed on maternal abdomen. Delayed cord clamping until pulsing stopped, but maternal bleeding began to be brisk and the cord was clamped and cut by FOB to expedite the delivery of the placenta. Iv fentanyl given to assist in evaluation of the perineum. Postpartum pit running and methergine ordered but not given.  Because of several hours of minimal variability without accels and repetitive early decels, cord gases were collected as above. The baby was promptly vigorous and active. Placenta delivered spontaneously and was intact. Small right labial laceration was hemostatic and not repaired. Fundus firm and bleeding wnl. She tolerated amazingly  well.  Christeen Douglas 06/03/2018, 7:25 AM

## 2017-11-19 LAB — OB RESULTS CONSOLE HIV ANTIBODY (ROUTINE TESTING): HIV: NONREACTIVE

## 2017-11-19 LAB — OB RESULTS CONSOLE HEPATITIS B SURFACE ANTIGEN: HEP B S AG: NEGATIVE

## 2017-11-19 LAB — OB RESULTS CONSOLE GC/CHLAMYDIA
Chlamydia: NEGATIVE
Gonorrhea: NEGATIVE

## 2017-11-19 LAB — OB RESULTS CONSOLE RUBELLA ANTIBODY, IGM: Rubella: NON-IMMUNE/NOT IMMUNE

## 2017-11-19 LAB — OB RESULTS CONSOLE VARICELLA ZOSTER ANTIBODY, IGG: VARICELLA IGG: NON-IMMUNE/NOT IMMUNE

## 2017-11-19 LAB — OB RESULTS CONSOLE RPR: RPR: NONREACTIVE

## 2017-12-23 ENCOUNTER — Other Ambulatory Visit: Payer: Self-pay | Admitting: Obstetrics and Gynecology

## 2017-12-23 ENCOUNTER — Other Ambulatory Visit: Payer: Self-pay | Admitting: Obstetrics & Gynecology

## 2017-12-23 DIAGNOSIS — R1010 Upper abdominal pain, unspecified: Secondary | ICD-10-CM

## 2017-12-24 ENCOUNTER — Ambulatory Visit (HOSPITAL_COMMUNITY): Payer: 59

## 2017-12-24 ENCOUNTER — Ambulatory Visit
Admission: RE | Admit: 2017-12-24 | Discharge: 2017-12-24 | Disposition: A | Discharge status: Home / Self Care | Payer: 59 | Source: Ambulatory Visit | Attending: Obstetrics & Gynecology | Admitting: Obstetrics & Gynecology

## 2017-12-24 DIAGNOSIS — R1010 Upper abdominal pain, unspecified: Secondary | ICD-10-CM | POA: Insufficient documentation

## 2018-05-23 LAB — OB RESULTS CONSOLE GBS: GBS: POSITIVE

## 2018-05-31 ENCOUNTER — Other Ambulatory Visit: Payer: Self-pay

## 2018-05-31 ENCOUNTER — Observation Stay
Admission: EM | Admit: 2018-05-31 | Discharge: 2018-05-31 | Disposition: A | Discharge status: Home / Self Care | Payer: 59 | Source: Home / Self Care | Admitting: Obstetrics and Gynecology

## 2018-05-31 DIAGNOSIS — O471 False labor at or after 37 completed weeks of gestation: Secondary | ICD-10-CM | POA: Diagnosis present

## 2018-05-31 DIAGNOSIS — Z3A37 37 weeks gestation of pregnancy: Secondary | ICD-10-CM

## 2018-05-31 DIAGNOSIS — O26893 Other specified pregnancy related conditions, third trimester: Secondary | ICD-10-CM

## 2018-05-31 DIAGNOSIS — Z87442 Personal history of urinary calculi: Secondary | ICD-10-CM | POA: Insufficient documentation

## 2018-05-31 DIAGNOSIS — F419 Anxiety disorder, unspecified: Secondary | ICD-10-CM

## 2018-05-31 DIAGNOSIS — O99343 Other mental disorders complicating pregnancy, third trimester: Secondary | ICD-10-CM

## 2018-05-31 LAB — URINALYSIS, ROUTINE W REFLEX MICROSCOPIC
BILIRUBIN URINE: NEGATIVE
GLUCOSE, UA: NEGATIVE mg/dL
HGB URINE DIPSTICK: NEGATIVE
KETONES UR: NEGATIVE mg/dL
Leukocytes, UA: NEGATIVE
NITRITE: NEGATIVE
PH: 7 (ref 5.0–8.0)
Protein, ur: NEGATIVE mg/dL
Specific Gravity, Urine: 1.006 (ref 1.005–1.030)

## 2018-05-31 MED ORDER — SODIUM CHLORIDE FLUSH 0.9 % IV SOLN
INTRAVENOUS | Status: AC
  Filled 2018-05-31: qty 10

## 2018-05-31 MED ORDER — LACTATED RINGERS IV SOLN
INTRAVENOUS | Status: DC
  Administered 2018-05-31: 09:00:00 via INTRAVENOUS

## 2018-05-31 MED ORDER — MORPHINE SULFATE (PF) 4 MG/ML IV SOLN
5.0000 mg | Freq: Once | INTRAVENOUS | Status: AC
  Administered 2018-05-31: 5 mg via INTRAVENOUS
  Filled 2018-05-31: qty 2

## 2018-05-31 MED ORDER — PROMETHAZINE HCL 25 MG/ML IJ SOLN
25.0000 mg | Freq: Once | INTRAMUSCULAR | Status: AC
  Administered 2018-05-31: 25 mg via INTRAMUSCULAR
  Filled 2018-05-31: qty 1

## 2018-05-31 MED ORDER — SODIUM CHLORIDE 0.9 % IV SOLN: 25.0000 mg | Freq: Once | INTRAVENOUS | Status: DC

## 2018-05-31 MED ORDER — MORPHINE SULFATE (PF) 4 MG/ML IV SOLN
5.0000 mg | Freq: Once | INTRAVENOUS | Status: AC
  Administered 2018-05-31: 5 mg via INTRAMUSCULAR
  Filled 2018-05-31: qty 2

## 2018-05-31 NOTE — Progress Notes (Signed)
MD ordered RN to give patient a morphine rest. Pt very tearful and not really wanting an IV.Pt requesting to speak with provider. Pt spoke with Heloise Ochoa, CNM as well as Dr. Dalbert Garnet. Pt agreed to IV for morphine rest. NST done on patient. Monitors removed per Heloise Ochoa, CNM for patient comfort and rest. Will continue to monitor.

## 2018-05-31 NOTE — Progress Notes (Signed)
TRIAGE VISIT with NST   Palmyra Rogacki is a 27 y.o. G2P1001. She is at  37+5wks presenting with signs of labor.  Indication: Contractions  S: Resting comfortably. Regular CTX, no VB. Active fetal movement.  O:  There were no vitals taken for this visit. No results found for this or any previous visit (from the past 48 hour(s)).   Gen: NAD, AAOx3      Abd: FNTTP      Ext: Non-tender, Nonedmeatous    NST/FHT: 130, mod var, +accels, no decels TOCO: q2-4 min [id number]: 3 Effacement (%): 50 Cervical Position: Posterior Station: Ballotable Presentation: Vertex Exam by:: Jory Sims  NST: Reactive. See FHT above for particulars.  A/P:  27 y.o. G2P1001 at 37+5wks  Labor: painful contractions, but unchanged cervix from the office. She will walk for 2 hrs. If contractions continue as strongly, will offer morphine rest for labor eval. If they reduce, will likely d/c home    Fetal Wellbeing: NST reactive Reassuring Cat 1 tracing.

## 2018-05-31 NOTE — Discharge Summary (Signed)
Gabriela Fuller is a 27 y.o. female. She is at [redacted]w[redacted]d gestation. Patient's last menstrual period was 09/09/2017. Estimated Date of Delivery: 06/16/18  Prenatal care site: [facility]   Current pregnancy complicated by:  1. Rubella and varicella Non-immune; needs PP immunization 2. Back pain in pregnancy- recommended stretching, yoga, massage; limit weight gain to rec 15-25lbs, exercise.  3. Seen at 15wks for epigastric pain- Nl labs, RUQ/Abd limited US ordered at [facility], [place]; GI consult 4. Varicose veins- worse R>L, Rx compression stockings; referral after delivery for vein specialist 5. GBS Bacteriuria; Rx 8/1; needs IP prophy  Chief complaint: Painful UCs since 1400 yesterday, right flank pain similar to pain with kidney stones in the past.    S: Resting comfortably. no CTX, no VB.no LOF,  Active fetal movement. Denies: HA, visual changes, SOB, or RUQ/epigastric pain  Maternal Medical History:   Past Medical History:  Diagnosis Date  . Anxiety   hx kidney stones.   Past Surgical History:  Procedure Laterality Date  . NO PAST SURGERIES      No Known Allergies  Prior to Admission medications   Medication Sig Start Date End Date Taking? Authorizing Provider  ibuprofen (ADVIL,MOTRIN) 800 MG tablet Take 1 tablet (800 mg total) by mouth every 8 (eight) hours as needed. 09/27/16  Yes Don Perking, [place], MD  Prenatal Vit-Fe Fumarate-FA (PRENATAL MULTIVITAMIN) TABS tablet Take 1 tablet by mouth daily at 12 noon.   Yes [provider]  oxyCODONE-acetaminophen (PERCOCET/ROXICET) 5-325 MG tablet Take 1-2 tablets by mouth every 6 (six) hours as needed for severe pain. Patient not taking: Reported on 05/31/2018 09/19/16   Karena Addison, CNM      Social History: She  reports that she has never smoked. She has never used smokeless tobacco. She reports that she does not drink alcohol or use drugs.  Family History:  no history of gyn cancers  Review of Systems: A full  review of systems was performed and negative except as noted in the HPI.     O:  BP 105/68   Pulse 74   Temp 98.1 F (36.7 C) (Oral)   LMP 09/09/2017  No results found for this or any previous visit (from the past 48 hour(s)).   Constitutional: NAD, AAOx3  HE/ENT: extraocular movements grossly intact, moist mucous membranes CV: RRR PULM: nl respiratory effort, CTABL     Abd: gravid, non-tender, non-distended, soft      Ext: Non-tender, Nonedematous   Psych: mood appropriate, speech normal Pelvic: repeat SVE unchanged   Fetal  monitoring: Cat I Appropriate for GA Baseline: 135bpm Variability: moderate Accelerations: absent/ present x >2 Decelerations absent Time    A/P: 27 y.o. [redacted]w[redacted]d here for antenatal surveillance for prodromal labor and right flank pain.  Labor: no cervical change after ambulation and then morphine rest. Pt reports feeling better with UCs.   Fetal Wellbeing: Reassuring Cat 1 tracing with reactive NST.  Right flank pain, pt reports pain c/w previous kidney stones but having intense UCs as well. Clean Catch UA sent: pending, will call pt with results.   D/c home stable, labor precautions reviewed- given Rx for Atarax and Percocet for therapeutic rest at home tonight if needed, follow-up as scheduled.     Mikaylah Libbey A, CNM 05/31/2018  1:11 PM

## 2018-05-31 NOTE — OB Triage Note (Signed)
Pt presents from ED with contractions starting at 2pm. Denies intercourse in the last 24 hours, Bleeding, decreased fetal movement,V/D.

## 2018-06-01 LAB — CULTURE, OB URINE: Culture: 20000 — AB

## 2018-06-02 ENCOUNTER — Inpatient Hospital Stay
Admission: EM | Admit: 2018-06-02 | Discharge: 2018-06-05 | DRG: 805 | Disposition: A | Discharge status: Home / Self Care | Payer: 59 | Attending: Obstetrics and Gynecology | Admitting: Obstetrics and Gynecology

## 2018-06-02 ENCOUNTER — Other Ambulatory Visit: Payer: Self-pay

## 2018-06-02 DIAGNOSIS — O99824 Streptococcus B carrier state complicating childbirth: Principal | ICD-10-CM | POA: Diagnosis present

## 2018-06-02 DIAGNOSIS — D62 Acute posthemorrhagic anemia: Secondary | ICD-10-CM | POA: Diagnosis not present

## 2018-06-02 DIAGNOSIS — Z3493 Encounter for supervision of normal pregnancy, unspecified, third trimester: Secondary | ICD-10-CM

## 2018-06-02 DIAGNOSIS — O874 Varicose veins of lower extremity in the puerperium: Secondary | ICD-10-CM | POA: Diagnosis present

## 2018-06-02 DIAGNOSIS — O9081 Anemia of the puerperium: Secondary | ICD-10-CM | POA: Diagnosis not present

## 2018-06-02 DIAGNOSIS — Z3A38 38 weeks gestation of pregnancy: Secondary | ICD-10-CM | POA: Diagnosis not present

## 2018-06-02 DIAGNOSIS — Z3483 Encounter for supervision of other normal pregnancy, third trimester: Secondary | ICD-10-CM | POA: Diagnosis present

## 2018-06-02 DIAGNOSIS — O411230 Chorioamnionitis, third trimester, not applicable or unspecified: Secondary | ICD-10-CM | POA: Diagnosis present

## 2018-06-02 DIAGNOSIS — Z3490 Encounter for supervision of normal pregnancy, unspecified, unspecified trimester: Secondary | ICD-10-CM

## 2018-06-02 LAB — CBC
HCT: 30.3 % — ABNORMAL LOW (ref 35.0–47.0)
HCT: 32.3 % — ABNORMAL LOW (ref 35.0–47.0)
Hemoglobin: 10.5 g/dL — ABNORMAL LOW (ref 12.0–16.0)
Hemoglobin: 11.1 g/dL — ABNORMAL LOW (ref 12.0–16.0)
MCH: 28.9 pg (ref 26.0–34.0)
MCH: 29 pg (ref 26.0–34.0)
MCHC: 34.3 g/dL (ref 32.0–36.0)
MCHC: 34.6 g/dL (ref 32.0–36.0)
MCV: 83.8 fL (ref 80.0–100.0)
MCV: 84.7 fL (ref 80.0–100.0)
PLATELETS: 146 10*3/uL — AB (ref 150–440)
PLATELETS: 123 10*3/uL — AB (ref 150–440)
RBC: 3.82 MIL/uL (ref 3.80–5.20)
RBC: 3.62 MIL/uL — AB (ref 3.80–5.20)
RDW: 13.5 % (ref 11.5–14.5)
RDW: 13.9 % (ref 11.5–14.5)
WBC: 23.6 10*3/uL — AB (ref 3.6–11.0)
WBC: 16.2 10*3/uL — AB (ref 3.6–11.0)

## 2018-06-02 LAB — TYPE AND SCREEN
ABO/RH(D): O POS
Antibody Screen: NEGATIVE

## 2018-06-02 LAB — BASIC METABOLIC PANEL
Anion gap: 8 (ref 5–15)
BUN: 10 mg/dL (ref 6–20)
CALCIUM: 9.1 mg/dL (ref 8.9–10.3)
CO2: 21 mmol/L — ABNORMAL LOW (ref 22–32)
CREATININE: 0.78 mg/dL (ref 0.44–1.00)
Chloride: 108 mmol/L (ref 98–111)
Glucose, Bld: 91 mg/dL (ref 70–99)
Potassium: 3.9 mmol/L (ref 3.5–5.1)
SODIUM: 137 mmol/L (ref 135–145)

## 2018-06-02 MED ORDER — OXYTOCIN 40 UNITS IN LACTATED RINGERS INFUSION - SIMPLE MED: 2.5000 [IU]/h | INTRAVENOUS | Status: DC

## 2018-06-02 MED ORDER — BUTORPHANOL TARTRATE 1 MG/ML IJ SOLN
1.0000 mg | INTRAMUSCULAR | Status: DC | PRN
  Administered 2018-06-02 – 2018-06-03 (×2): 1 mg via INTRAVENOUS
  Filled 2018-06-02 (×2): qty 1

## 2018-06-02 MED ORDER — GENTAMICIN SULFATE 40 MG/ML IJ SOLN
5.0000 mg/kg | INTRAVENOUS | Status: DC
  Administered 2018-06-02 – 2018-06-03 (×2): 400 mg via INTRAVENOUS
  Filled 2018-06-02 (×3): qty 10

## 2018-06-02 MED ORDER — OXYCODONE-ACETAMINOPHEN 5-325 MG PO TABS: 2.0000 | ORAL_TABLET | ORAL | Status: DC | PRN

## 2018-06-02 MED ORDER — ACETAMINOPHEN 325 MG PO TABS
650.0000 mg | ORAL_TABLET | ORAL | Status: DC | PRN
  Administered 2018-06-02 – 2018-06-03 (×4): 650 mg via ORAL
  Filled 2018-06-02 (×4): qty 2

## 2018-06-02 MED ORDER — OXYTOCIN 40 UNITS IN LACTATED RINGERS INFUSION - SIMPLE MED
1.0000 m[IU]/min | INTRAVENOUS | Status: DC
  Administered 2018-06-02: 2 m[IU]/min via INTRAVENOUS
  Filled 2018-06-02: qty 1000

## 2018-06-02 MED ORDER — ONDANSETRON HCL 4 MG/2ML IJ SOLN: 4.0000 mg | Freq: Four times a day (QID) | INTRAMUSCULAR | Status: DC | PRN

## 2018-06-02 MED ORDER — TERBUTALINE SULFATE 1 MG/ML IJ SOLN: 0.2500 mg | Freq: Once | INTRAMUSCULAR | Status: DC | PRN

## 2018-06-02 MED ORDER — LACTATED RINGERS IV SOLN: 500.0000 mL | INTRAVENOUS | Status: DC | PRN

## 2018-06-02 MED ORDER — LIDOCAINE HCL (PF) 1 % IJ SOLN
30.0000 mL | INTRAMUSCULAR | Status: AC | PRN
  Administered 2018-06-03: 1.2 mL via SUBCUTANEOUS
  Filled 2018-06-02: qty 30

## 2018-06-02 MED ORDER — LACTATED RINGERS IV SOLN: INTRAVENOUS | Status: DC

## 2018-06-02 MED ORDER — LACTATED RINGERS IV SOLN
INTRAVENOUS | Status: DC
  Administered 2018-06-02: 18:00:00 via INTRAVENOUS

## 2018-06-02 MED ORDER — MISOPROSTOL 200 MCG PO TABS
ORAL_TABLET | ORAL | Status: AC
  Filled 2018-06-02: qty 4

## 2018-06-02 MED ORDER — OXYTOCIN 40 UNITS IN LACTATED RINGERS INFUSION - SIMPLE MED
2.5000 [IU]/h | INTRAVENOUS | Status: DC
  Administered 2018-06-03: 2.5 [IU]/h via INTRAVENOUS
  Filled 2018-06-02: qty 1000

## 2018-06-02 MED ORDER — OXYTOCIN BOLUS FROM INFUSION
500.0000 mL | Freq: Once | INTRAVENOUS | Status: AC
  Administered 2018-06-03: 500 mL via INTRAVENOUS

## 2018-06-02 MED ORDER — SODIUM CHLORIDE 0.9 % IV SOLN
2.0000 g | Freq: Four times a day (QID) | INTRAVENOUS | Status: DC
  Administered 2018-06-02 – 2018-06-04 (×7): 2 g via INTRAVENOUS
  Filled 2018-06-02: qty 2
  Filled 2018-06-02 (×3): qty 2000
  Filled 2018-06-02: qty 2
  Filled 2018-06-02: qty 2000
  Filled 2018-06-02 (×2): qty 2
  Filled 2018-06-02 (×2): qty 2000

## 2018-06-02 MED ORDER — LACTATED RINGERS IV SOLN
500.0000 mL | INTRAVENOUS | Status: DC | PRN
  Administered 2018-06-03: 1000 mL via INTRAVENOUS
  Administered 2018-06-03: 500 mL via INTRAVENOUS

## 2018-06-02 MED ORDER — SOD CITRATE-CITRIC ACID 500-334 MG/5ML PO SOLN: 30.0000 mL | ORAL | Status: DC | PRN

## 2018-06-02 MED ORDER — OXYCODONE-ACETAMINOPHEN 5-325 MG PO TABS
1.0000 | ORAL_TABLET | ORAL | Status: DC | PRN
  Administered 2018-06-03: 1 via ORAL
  Filled 2018-06-02: qty 1

## 2018-06-02 NOTE — H&P (Signed)
OB ADMISSION/ HISTORY & PHYSICAL:  Admission Date: 06/02/2018  3:35 PM  Admit Diagnosis: Active labor, concern for uterine infection, Cat II strip  Gabriela Fuller is a 27 y.o. female presenting for augmentation of labor with concern for intrauterine infection prior to labor. She was contracting frequently with generalized body aches, high fever and negative flu swab. Prior hx of postpartum endometritis. GBS positive in urine. Fundal tenderness  Prenatal History: G3P1011   EDC : 06/16/2018, by Last Menstrual Period  Prenatal care at [facility] Prenatal course complicated by:  Intraamniotic infection prior to labor  Rubella and varicella Non-immune; needs PP immunization  Back pain in pregnancy- recommended stretching, yoga, massage; limit weight gain to rec 15-25lbs, exercise.   Seen at 15wks for epigastric pain- Nl labs, RUQ/Abd limited US ordered at [facility]; GI consult  Varicose veins- worse R>L, Rx compression stockings; referral after delivery for vein specialist  GBS Bacteriuria; Rx 8/1; needs IP prophy   Medical / Surgical History :  Past medical history:  Past Medical History:  Diagnosis Date  . Anxiety      Past surgical history:  Past Surgical History:  Procedure Laterality Date  . NO PAST SURGERIES      Family History: History reviewed. No pertinent family history.   Social History:  reports that she has never smoked. She has never used smokeless tobacco. She reports that she does not drink alcohol or use drugs.   Allergies: Patient has no known allergies.    Current Medications at time of admission:  Prior to Admission medications   Medication Sig Start Date End Date Taking? Authorizing Provider  acetaminophen (TYLENOL) 500 MG tablet Take 500 mg by mouth every 6 (six) hours as needed.   Yes [provider]  ibuprofen (ADVIL,MOTRIN) 800 MG tablet Take 1 tablet (800 mg total) by mouth every 8 (eight) hours as needed. 09/27/16  Yes Don Perking, [place], MD   oxyCODONE-acetaminophen (PERCOCET/ROXICET) 5-325 MG tablet Take 1-2 tablets by mouth every 6 (six) hours as needed for severe pain. Patient not taking: Reported on 05/31/2018 09/19/16   Karena Addison, CNM  Prenatal Vit-Fe Fumarate-FA (PRENATAL MULTIVITAMIN) TABS tablet Take 1 tablet by mouth daily at 12 noon.    [provider]     Review of Systems: Active FM onset of ctx 3 days ago currently every 4 minutes LOF  / SROM: n/a bloody show n/a   Physical Exam:  VS: Blood pressure 113/81, pulse (!) 57, temperature 97.8 F (36.6 C), temperature source Oral, resp. rate 18, height 5\' 7"  (1.702 m), weight 80.7 kg, last menstrual period 09/09/2017, SpO2 98 %, unknown if currently breastfeeding.  General: alert and oriented, appears in some distress Heart: RRR Lungs: Clear lung fields Abdomen: Gravid, soft and non-tender, non-distended / uterus: tender to palpation Extremities: no edema  FHT: 165, moderate variability, +accels, no decels TOCO: q4 min SVE:  Dilation: 10 / Effacement (%): 100 /  4/50/-2   Cephalic by leopolds  Prenatal Labs: Blood type/Rh --/--/O POS (10/03 1638)  Antibody screen neg  Rubella Immune  Varicella Immune  RPR NR  HBsAg Neg  HIV NR  GC neg  Chlamydia neg  Genetic screening negative     3 hour GTT n/a  GBS pos   No results found.  Assessment: 26+[redacted] weeks gestation 1 stage of labor FHR category Cat II - fetal tachycardia on admission   Plan:   Admit for augmentation of labor Amp/Gent ordered for concern for intraamniotic infection  Scheduled  tylenol for fever control IVF Labs pending Epidural when desired Continuous fetal monitoring   1. Fetal Well being  - Fetal Tracing: Cat II - Ultrasound:  reviewed, as above - Group B Streptococcus: pos - Presentation: vtx confirmed by Leopolds   2. Routine OB: - Prenatal labs reviewed, as above - Rh O pos  3. Induction of Labor:  -  Contractions external toco in place -   Plan for induction with pitocin and AROM  4. Post Partum Planning: - Infant feeding: breast - Contraception: ocps

## 2018-06-02 NOTE — Progress Notes (Signed)
Temp kept controlled with tylenol, and patient is feeling somewhat better. Because her WBC on arrival was 23, anesthesia is concerned about epidural administration. We have given her meds for chorio, amp 2g q6 and gent 5mg /kg x24, and will recheck her WBC 6 hours after med administration. If improving, will plan for epidural. If not, will plan for nitrous and iv pain meds PRN.  AROM for clear fluid and IUPC placed.  FHT: Cat I strip Toco: q2-3 min, pitocin at 8 SVE: 4/50/-2, posterior, medium  A/P:  - Fetal tachycardia: resolved with maternal temp lowered - Maternal tachycardia and fever: suppressed with tylenol. Abx on board for 6 hours now. - Presumed intraamniotic infection: Abx iv until clinical improvement - IOL: AROM for clear fluid. Continuous monitoring with pitocin titration per protocol. - Pain management: repeat wbc for epidural if desired, plan for stadol and nitrous PRN - GBS positive: covered with amp

## 2018-06-03 ENCOUNTER — Inpatient Hospital Stay: Payer: 59 | Admitting: Anesthesiology

## 2018-06-03 LAB — GENTAMICIN LEVEL, RANDOM: Gentamicin Rm: 0.5 ug/mL

## 2018-06-03 MED ORDER — COCONUT OIL OIL
1.0000 "application " | TOPICAL_OIL | Status: DC | PRN
  Administered 2018-06-03: 1 via TOPICAL
  Filled 2018-06-03: qty 120

## 2018-06-03 MED ORDER — FENTANYL CITRATE (PF) 100 MCG/2ML IJ SOLN
50.0000 ug | Freq: Once | INTRAMUSCULAR | Status: AC
  Administered 2018-06-03: 50 ug via INTRAVENOUS

## 2018-06-03 MED ORDER — BENZOCAINE-MENTHOL 20-0.5 % EX AERO: 1.0000 "application " | INHALATION_SPRAY | CUTANEOUS | Status: DC | PRN

## 2018-06-03 MED ORDER — SIMETHICONE 80 MG PO CHEW: 80.0000 mg | CHEWABLE_TABLET | ORAL | Status: DC | PRN

## 2018-06-03 MED ORDER — METHYLERGONOVINE MALEATE 0.2 MG/ML IJ SOLN
INTRAMUSCULAR | Status: AC
  Filled 2018-06-03: qty 1

## 2018-06-03 MED ORDER — WITCH HAZEL-GLYCERIN EX PADS
1.0000 "application " | MEDICATED_PAD | CUTANEOUS | Status: DC
  Administered 2018-06-03: 1 via TOPICAL
  Filled 2018-06-03: qty 100

## 2018-06-03 MED ORDER — PRENATAL MULTIVITAMIN CH
1.0000 | ORAL_TABLET | Freq: Every day | ORAL | Status: DC
  Administered 2018-06-03: 1 via ORAL
  Filled 2018-06-03: qty 1

## 2018-06-03 MED ORDER — FENTANYL CITRATE (PF) 100 MCG/2ML IJ SOLN
INTRAMUSCULAR | Status: AC
  Filled 2018-06-03: qty 2

## 2018-06-03 MED ORDER — LACTATED RINGERS IV SOLN: 500.0000 mL | Freq: Once | INTRAVENOUS | Status: DC

## 2018-06-03 MED ORDER — DOCUSATE SODIUM 100 MG PO CAPS
100.0000 mg | ORAL_CAPSULE | Freq: Two times a day (BID) | ORAL | Status: DC
  Administered 2018-06-03 – 2018-06-05 (×4): 100 mg via ORAL
  Filled 2018-06-03 (×4): qty 1

## 2018-06-03 MED ORDER — SODIUM CHLORIDE 0.9 % IV SOLN: 250.0000 mL | INTRAVENOUS | Status: DC | PRN

## 2018-06-03 MED ORDER — FENTANYL 2.5 MCG/ML W/ROPIVACAINE 0.15% IN NS 100 ML EPIDURAL (ARMC)
EPIDURAL | Status: AC
  Filled 2018-06-03: qty 100

## 2018-06-03 MED ORDER — ONDANSETRON HCL 4 MG PO TABS: 4.0000 mg | ORAL_TABLET | ORAL | Status: DC | PRN

## 2018-06-03 MED ORDER — LACTATED RINGERS IV SOLN
INTRAVENOUS | Status: DC
  Administered 2018-06-03 – 2018-06-04 (×2): via INTRAVENOUS

## 2018-06-03 MED ORDER — BISACODYL 10 MG RE SUPP: 10.0000 mg | Freq: Every day | RECTAL | Status: DC | PRN

## 2018-06-03 MED ORDER — WITCH HAZEL-GLYCERIN EX PADS: 1.0000 "application " | MEDICATED_PAD | CUTANEOUS | Status: DC | PRN

## 2018-06-03 MED ORDER — DIPHENHYDRAMINE HCL 50 MG/ML IJ SOLN: 12.5000 mg | INTRAMUSCULAR | Status: DC | PRN

## 2018-06-03 MED ORDER — FENTANYL 2.5 MCG/ML W/ROPIVACAINE 0.15% IN NS 100 ML EPIDURAL (ARMC)
12.0000 mL/h | EPIDURAL | Status: DC
  Filled 2018-06-03: qty 100

## 2018-06-03 MED ORDER — LIDOCAINE-EPINEPHRINE (PF) 1.5 %-1:200000 IJ SOLN
INTRAMUSCULAR | Status: DC | PRN
  Administered 2018-06-03: 3 mL via EPIDURAL

## 2018-06-03 MED ORDER — EPHEDRINE 5 MG/ML INJ
10.0000 mg | INTRAVENOUS | Status: DC | PRN
  Filled 2018-06-03: qty 2

## 2018-06-03 MED ORDER — FENTANYL 2.5 MCG/ML W/ROPIVACAINE 0.15% IN NS 100 ML EPIDURAL (ARMC)
EPIDURAL | Status: DC | PRN
  Administered 2018-06-03: 12 mL/h via EPIDURAL

## 2018-06-03 MED ORDER — KETOROLAC TROMETHAMINE 30 MG/ML IJ SOLN
30.0000 mg | Freq: Once | INTRAMUSCULAR | Status: AC
  Administered 2018-06-03: 30 mg via INTRAVENOUS
  Filled 2018-06-03: qty 1

## 2018-06-03 MED ORDER — DIPHENHYDRAMINE HCL 25 MG PO CAPS: 25.0000 mg | ORAL_CAPSULE | Freq: Four times a day (QID) | ORAL | Status: DC | PRN

## 2018-06-03 MED ORDER — DIBUCAINE 1 % RE OINT: 1.0000 "application " | TOPICAL_OINTMENT | RECTAL | Status: DC | PRN

## 2018-06-03 MED ORDER — OXYCODONE HCL 5 MG PO TABS
5.0000 mg | ORAL_TABLET | ORAL | Status: DC | PRN
  Administered 2018-06-03 – 2018-06-05 (×5): 5 mg via ORAL
  Filled 2018-06-03 (×5): qty 1

## 2018-06-03 MED ORDER — TETANUS-DIPHTH-ACELL PERTUSSIS 5-2.5-18.5 LF-MCG/0.5 IM SUSP: 0.5000 mL | Freq: Once | INTRAMUSCULAR | Status: DC

## 2018-06-03 MED ORDER — PHENYLEPHRINE 40 MCG/ML (10ML) SYRINGE FOR IV PUSH (FOR BLOOD PRESSURE SUPPORT)
80.0000 ug | PREFILLED_SYRINGE | INTRAVENOUS | Status: DC | PRN
  Filled 2018-06-03: qty 5

## 2018-06-03 MED ORDER — PRENATAL MULTIVITAMIN CH
1.0000 | ORAL_TABLET | Freq: Every day | ORAL | Status: DC
  Administered 2018-06-04 – 2018-06-05 (×2): 1 via ORAL
  Filled 2018-06-03 (×2): qty 1

## 2018-06-03 MED ORDER — COCONUT OIL OIL: 1.0000 "application " | TOPICAL_OIL | Status: DC | PRN

## 2018-06-03 MED ORDER — SODIUM CHLORIDE 0.9% FLUSH: 3.0000 mL | INTRAVENOUS | Status: DC | PRN

## 2018-06-03 MED ORDER — BUPIVACAINE HCL (PF) 0.25 % IJ SOLN
INTRAMUSCULAR | Status: DC | PRN
  Administered 2018-06-03 (×2): 5 mL via EPIDURAL

## 2018-06-03 MED ORDER — MEASLES, MUMPS & RUBELLA VAC ~~LOC~~ INJ
0.5000 mL | INJECTION | Freq: Once | SUBCUTANEOUS | Status: DC
  Filled 2018-06-03: qty 0.5

## 2018-06-03 MED ORDER — BENZOCAINE-MENTHOL 20-0.5 % EX AERO
INHALATION_SPRAY | CUTANEOUS | Status: AC
  Filled 2018-06-03: qty 56

## 2018-06-03 MED ORDER — OXYCODONE HCL 5 MG PO TABS: 5.0000 mg | ORAL_TABLET | ORAL | Status: DC | PRN

## 2018-06-03 MED ORDER — BENZOCAINE-MENTHOL 20-0.5 % EX AERO
1.0000 "application " | INHALATION_SPRAY | CUTANEOUS | Status: DC | PRN
  Administered 2018-06-03: 1 via TOPICAL

## 2018-06-03 MED ORDER — SENNOSIDES-DOCUSATE SODIUM 8.6-50 MG PO TABS: 2.0000 | ORAL_TABLET | ORAL | Status: DC

## 2018-06-03 MED ORDER — ACETAMINOPHEN 500 MG PO TABS
1000.0000 mg | ORAL_TABLET | Freq: Four times a day (QID) | ORAL | Status: DC | PRN
  Administered 2018-06-03 (×2): 1000 mg via ORAL
  Filled 2018-06-03 (×2): qty 2

## 2018-06-03 MED ORDER — FLEET ENEMA 7-19 GM/118ML RE ENEM: 1.0000 | ENEMA | Freq: Every day | RECTAL | Status: DC | PRN

## 2018-06-03 MED ORDER — ACETAMINOPHEN 325 MG PO TABS: 650.0000 mg | ORAL_TABLET | ORAL | Status: DC | PRN

## 2018-06-03 MED ORDER — ONDANSETRON HCL 4 MG/2ML IJ SOLN: 4.0000 mg | INTRAMUSCULAR | Status: DC | PRN

## 2018-06-03 MED ORDER — FENTANYL CITRATE (PF) 100 MCG/2ML IJ SOLN
10.0000 ug | INTRAMUSCULAR | Status: DC | PRN
  Administered 2018-06-03: 10 ug via INTRAVENOUS

## 2018-06-03 MED ORDER — IBUPROFEN 600 MG PO TABS
600.0000 mg | ORAL_TABLET | Freq: Four times a day (QID) | ORAL | Status: DC
  Administered 2018-06-03: 600 mg via ORAL
  Filled 2018-06-03: qty 1

## 2018-06-03 MED ORDER — IBUPROFEN 600 MG PO TABS
600.0000 mg | ORAL_TABLET | Freq: Four times a day (QID) | ORAL | Status: DC
  Administered 2018-06-03 – 2018-06-05 (×8): 600 mg via ORAL
  Filled 2018-06-03 (×7): qty 1

## 2018-06-03 MED ORDER — SODIUM CHLORIDE 0.9% FLUSH: 3.0000 mL | Freq: Two times a day (BID) | INTRAVENOUS | Status: DC

## 2018-06-03 MED ORDER — ZOLPIDEM TARTRATE 5 MG PO TABS: 5.0000 mg | ORAL_TABLET | Freq: Every evening | ORAL | Status: DC | PRN

## 2018-06-03 NOTE — Lactation Note (Signed)
This note was copied from a baby's chart. Lactation Consultation Note  Patient Name: Gabriela Fuller Today's Date: 06/03/2018 Reason for consult: Initial assessment   Maternal Data    Feeding Feeding Type: Breast Fed  LATCH Score Latch: Repeated attempts needed to sustain latch, nipple held in mouth throughout feeding, stimulation needed to elicit sucking reflex.  Audible Swallowing: Spontaneous and intermittent  Type of Nipple: Everted at rest and after stimulation  Comfort (Breast/Nipple): Filling, red/small blisters or bruises, mild/mod discomfort  Hold (Positioning): No assistance needed to correctly position infant at breast.  LATCH Score: 8  Interventions Interventions: Breast feeding basics reviewed;Comfort gels;Coconut oil  Lactation Tools Discussed/Used     Consult Status  LC assisted mother with latch of infant. Mother states that she breastfed her older child for about 4 months and states that she had a lip tie that made breastfeeding challenging. Infant latched on well but tends to curls lips in. LC educated mother on how to flange lips out to achieve a deep latch and for better comfort. Mother states that she is noticing a small blister and crack on her left nipple. LC observed the area and saw redness and a small crack forming around the side of the nipple and provided comfort gels. Mother has coconut oil at the bedside.    Arlyss Gandy 06/03/2018, 4:36 PM

## 2018-06-03 NOTE — Anesthesia Preprocedure Evaluation (Signed)
Anesthesia Evaluation  Patient identified by MRN, date of birth, ID band Patient awake    Reviewed: Allergy & Precautions, NPO status , Patient's Chart, lab work & pertinent test results  Airway Mallampati: II       Dental   Pulmonary neg pulmonary ROS,           Cardiovascular negative cardio ROS       Neuro/Psych negative neurological ROS  negative psych ROS   GI/Hepatic Neg liver ROS, GERD  ,  Endo/Other  negative endocrine ROS  Renal/GU negative Renal ROS  negative genitourinary   Musculoskeletal negative musculoskeletal ROS (+)   Abdominal   Peds negative pediatric ROS (+)  Hematology negative hematology ROS (+)   Anesthesia Other Findings   Reproductive/Obstetrics (+) Pregnancy                             Anesthesia Physical Anesthesia Plan  ASA: II  Anesthesia Plan: Epidural   Post-op Pain Management:    Induction:   PONV Risk Score and Plan:   Airway Management Planned:   Additional Equipment:   Intra-op Plan:   Post-operative Plan:   Informed Consent: I have reviewed the patients History and Physical, chart, labs and discussed the procedure including the risks, benefits and alternatives for the proposed anesthesia with the patient or authorized representative who has indicated his/her understanding and acceptance.     Plan Discussed with:   Anesthesia Plan Comments:         Anesthesia Quick Evaluation

## 2018-06-03 NOTE — Discharge Summary (Signed)
Obstetrical Discharge Summary  Patient Name: Gabriela Fuller DOB: April 18, 1991 MRN: [id number]  Date of Admission: 06/02/2018 Date of Discharge: 06/05/2018  Primary OB: [facility] Gestational Age at Delivery: [redacted]w[redacted]d   Antepartum complications:  1. Rubella non-immune 2. Varicella non-immune 3. Back pain in pregnancy 4. 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 GBS bacteriuria   Admitting Diagnosis:  Secondary Diagnosis: Patient Active Problem List   Diagnosis Date Noted  . Supervision of normal pregnancy in third trimester 06/02/2018  . False labor after 37 weeks of gestation without delivery 05/31/2018  . Pregnancy 09/16/2016  . Abdominal pain affecting pregnancy 09/02/2016  . Abdominal pain during pregnancy in third trimester 09/02/2016  . Family history of developmental disability 03/09/2016    Augmentation: AROM and Pitocin Complications: Intrauterine Inflammation or infection (Chorioamniotis) Intrapartum complications/course:  40JW [id number] at 38+1wks admitted with pre-labor intrauterine infection. GBS positive, good fetal movement, but fetal and maternal tachycardia on admission that improved with abx and acetominophen. She was admitted with a cervix of 4cm and contracting q3-4 min, temp at home of 102.9 and improved to 101.5 with tylenol. She was started on amp/gent on admission, and AROM for clear fluid 4 hrs later. Her initial WBC was 26 and on repeat 6 hrs later was 16. She received an epidural, but a hole in the bag drained the full 1.5% lidocaine with Epi 1:200 K onto the floor. She received a second bolus when she was pushing in the second stage with good control. She pushed in all positions and delivered over an intact perineum with minimal pain control. She did well. Baby was placed on maternal abdomen. Delayed cord clamping until pulsing stopped, but maternal bleeding began to be brisk and the cord was clamped and cut by FOB to expedite the delivery of the  placenta. Iv fentanyl given to assist in evaluation of the perineum. Postpartum pit running and methergine ordered but not given.  Because of several hours of minimal variability without accels and repetitive early decels, cord gases were collected as above. The baby was promptly vigorous and active. Placenta delivered spontaneously and was intact. Small right labial laceration was hemostatic and not repaired. Fundus firm and bleeding wnl. She tolerated amazingly well.  Date of Delivery: 06/03/18 Delivered By: Christeen Douglas  Delivery Type: spontaneous vaginal delivery Anesthesia: epidural and epidural bolus Placenta: Spontaneous Laceration: right labial Episiotomy: none Newborn Data: Live born female "[name]" Birth Weight: 3760g 8lb 4.6oz  APGAR: 8, 9  Newborn Delivery   Birth date/time:  06/03/2018 06:22:00 Delivery type:  Vaginal, Spontaneous    Discharge Physical Exam:  BP 116/67 (BP Location: Left Arm)   Pulse (!) 51   Temp 98.2 F (36.8 C) (Oral)   Resp 18   Ht 5\' 7"  (1.702 m)   Wt 80.7 kg   LMP 09/09/2017   SpO2 100%   Breastfeeding? Unknown   BMI 27.88 kg/m   General: NAD CV: RRR Pulm: CTABL, nl effort ABD: s/nd/nt, fundus firm and below the umbilicus Lochia: moderate DVT Evaluation: LE non-ttp, no evidence of DVT on exam.  Hemoglobin  Date Value Ref Range Status  06/05/2018 9.8 (L) 12.0 - 16.0 g/dL Final   HGB  Date Value Ref Range Status  09/25/2014 12.8 12.0 - 16.0 g/dL Final   HCT  Date Value Ref Range Status  06/05/2018 29.1 (L) 35.0 - 47.0 % Final  09/25/2014 38.8 35.0 - 47.0 % Final    Post partum course:  Patient had an uncomplicated postpartum course.  By time of discharge on PPD#2, her pain was controlled on oral pain medications; she had appropriate lochia and was ambulating, voiding without difficulty and tolerating regular diet.  She was deemed stable for discharge to home.    Postpartum Procedures: none Disposition: stable,  discharge to home. Baby Feeding: breastmilk Baby Disposition: home with mom  Rh Immune globulin given: n/a Rubella vaccine given: offered PP Varicella vaccine given: offered PP Tdap vaccine given in AP or PP setting: AP 03/29/2018 Flu vaccine given in AP or PP setting: offered PP  Contraception: TBD  Prenatal Labs: Blood type/Rh --/--/O POS (10/03 1638)  Antibody screen neg  Rubella Non-immune  Varicella Non-immune  RPR NR  HBsAg Neg  HIV NR  GC neg  Chlamydia neg  Genetic screening negative  1 hour GTT 82  3 hour GTT n/a  GBS positive     Plan:  Gabriela Fuller was discharged to home in good condition. Follow-up appointment at [facility] with delivering provider in 6 weeks  Discharge Medications: Allergies as of 06/05/2018   No Known Allergies     Medication List    STOP taking these medications   oxyCODONE-acetaminophen 5-325 MG tablet Commonly known as:  PERCOCET/ROXICET     TAKE these medications   acetaminophen 325 MG tablet Commonly known as:  TYLENOL Take 2 tablets (650 mg total) by mouth every 4 (four) hours as needed for mild pain or moderate pain. What changed:    medication strength  how much to take  when to take this  reasons to take this   ferrous sulfate 325 (65 FE) MG tablet Take 1 tablet (325 mg total) by mouth 2 (two) times daily with a meal.   ibuprofen 600 MG tablet Commonly known as:  ADVIL,MOTRIN Take 1 tablet (600 mg total) by mouth every 6 (six) hours as needed for mild pain, moderate pain or cramping. What changed:    medication strength  how much to take  when to take this  reasons to take this   oxyCODONE 5 MG immediate release tablet Commonly known as:  Oxy IR/ROXICODONE Take 1 tablet (5 mg total) by mouth every 4 (four) hours as needed for severe pain or breakthrough pain.   prenatal multivitamin Tabs tablet Take 1 tablet by mouth daily at 12 noon.       Follow-up Information    Christeen Douglas, MD. Schedule an appointment as soon as possible for a visit in 6 week(s).   Specialty:  Obstetrics and Gynecology Why:  For routine postpartum visit Contact information: [address] [place] [address] [phone]           Signed: Genia Del, CNM 06/05/2018 11:39 AM

## 2018-06-03 NOTE — Progress Notes (Signed)
Pt states she feels "everything like she did before the epidural". Spoke to [place] MD about pt pain level and discomfort, empty epidural bag, air in line, and noticed the spike was through the epidural tubing port. Asked MDA to come replace line, bag, and to assess pt. MDA states he will come.

## 2018-06-03 NOTE — Anesthesia Procedure Notes (Signed)
Epidural Patient location during procedure: OB Start time: 06/03/2018 1:43 AM End time: 06/03/2018 2:05 AM  Staffing Performed: anesthesiologist   Preanesthetic Checklist Completed: patient identified, site marked, surgical consent, pre-op evaluation, timeout performed, IV checked, risks and benefits discussed and monitors and equipment checked  Epidural Patient position: sitting Prep: Betadine Patient monitoring: heart rate, continuous pulse ox and blood pressure Approach: midline Location: L4-L5 Injection technique: LOR saline  Needle:  Needle type: Tuohy  Needle gauge: 17 G Needle length: 9 cm and 9 Needle insertion depth: 7 cm Catheter type: closed end flexible Catheter size: 20 Guage Catheter at skin depth: 13 cm Test dose: negative and 1.5% lidocaine with Epi 1:200 K  Assessment Events: blood not aspirated, injection not painful, no injection resistance, negative IV test and no paresthesia  Additional Notes   Patient tolerated the insertion well without complications.Reason for block:procedure for pain

## 2018-06-03 NOTE — Progress Notes (Signed)
  SVE: 5/70/0 midposition and moderately soft IUPC in place Consider epidural with WBC <20

## 2018-06-04 LAB — CBC
HCT: 26.7 % — ABNORMAL LOW (ref 35.0–47.0)
Hemoglobin: 9.1 g/dL — ABNORMAL LOW (ref 12.0–16.0)
MCH: 28.1 pg (ref 26.0–34.0)
MCHC: 33.9 g/dL (ref 32.0–36.0)
MCV: 83.1 fL (ref 80.0–100.0)
PLATELETS: 136 10*3/uL — AB (ref 150–440)
RBC: 3.22 MIL/uL — ABNORMAL LOW (ref 3.80–5.20)
RDW: 13.7 % (ref 11.5–14.5)
WBC: 11.8 10*3/uL — ABNORMAL HIGH (ref 3.6–11.0)

## 2018-06-04 LAB — RPR: RPR: NONREACTIVE

## 2018-06-04 MED ORDER — FERROUS SULFATE 325 (65 FE) MG PO TABS
325.0000 mg | ORAL_TABLET | Freq: Two times a day (BID) | ORAL | Status: DC
  Administered 2018-06-04 – 2018-06-05 (×3): 325 mg via ORAL
  Filled 2018-06-04 (×3): qty 1

## 2018-06-04 MED ORDER — VARICELLA VIRUS VACCINE LIVE 1350 PFU/0.5ML IJ SUSR: 0.5000 mL | INTRAMUSCULAR | Status: DC | PRN

## 2018-06-04 MED ORDER — INFLUENZA VAC SPLIT QUAD 0.5 ML IM SUSY: 0.5000 mL | PREFILLED_SYRINGE | INTRAMUSCULAR | Status: DC | PRN

## 2018-06-04 MED ORDER — MEASLES, MUMPS & RUBELLA VAC ~~LOC~~ INJ
0.5000 mL | INJECTION | Freq: Once | SUBCUTANEOUS | Status: DC
  Filled 2018-06-04: qty 0.5

## 2018-06-04 NOTE — Lactation Note (Signed)
This note was copied from a baby's chart. Lactation Consultation Note  Patient Name: Gabriela Fuller Date: 06/04/2018 Reason for consult: Follow-up assessment;Mother's request;Early term 37-38.6wks;Other (Comment)(Trauma to nipples) Mom concerned that [name] is cluster feeding.  Observed latch.  Mom letting him get shallow latch and pulling back on breast to make breathing room.  Discouraged mom from pulling back on breast demonstrating how he can breath well with nose and chin touching breast which ensures that he has a deeper latch.  When [name] comes off the breast, he continues to root back for the breast.  Mom's nipples are pinched with a positional stripe more prominent on left breast.  Coconut oil and comfort gels given and instructed in how to rotate use.  Reviewed supply and demand, normal course of lactation and routine newborn feeding patterns.  Lactation name and number written on white board and encouraged to call with any questions, concerns or assistance.  Maternal Data Formula Feeding for Exclusion: No Has patient been taught Hand Expression?: Yes Does the patient have breastfeeding experience prior to this delivery?: Yes  Feeding Feeding Type: Breast Fed  LATCH Score Latch: Grasps breast easily, tongue down, lips flanged, rhythmical sucking.  Audible Swallowing: A few with stimulation  Type of Nipple: Everted at rest and after stimulation  Comfort (Breast/Nipple): Filling, red/small blisters or bruises, mild/mod discomfort  Hold (Positioning): No assistance needed to correctly position infant at breast.  LATCH Score: 8  Interventions Interventions: Breast feeding basics reviewed;Skin to skin;Breast massage;Breast compression;Adjust position;Support pillows;Position options;Coconut oil;Comfort gels  Lactation Tools Discussed/Used Tools: Coconut oil;Comfort gels WIC Program: No(UHC)   Consult Status Consult Status: Follow-up Follow-up type: Call as  needed    [name] 06/04/2018, 7:58 PM

## 2018-06-04 NOTE — Progress Notes (Addendum)
Post Partum Day 1  Subjective: Doing well, no concerns. Ambulating without difficulty, pain managed with PO meds, tolerating regular diet, and voiding without difficulty.   No fever/chills, chest pain, shortness of breath, nausea/vomiting, or leg pain. No nipple or breast pain.   Objective: BP 112/71 (BP Location: Right Arm)   Pulse 65   Temp 97.6 F (36.4 C) (Oral)   Resp 18   Ht _0  (1.702 m)   Wt 80.7 kg   LMP 09/09/2017   SpO2 100%   Breastfeeding? Unknown   BMI 27.88 kg/m    Physical Exam:  General: alert, cooperative, appears stated age and no distress CV: RRR Pulm: nl effort, CTABL Abdomen: soft, non-tender, active bowel sounds Uterine Fundus: firm Lochia: appropriate DVT Evaluation: No evidence of DVT seen on physical exam. No cords or calf tenderness. No significant calf/ankle edema.  Recent Labs    06/02/18 2334 06/04/18 0423  HGB 10.5* 9.1*  HCT 30.3* 26.7*  WBC 16.2* 11.8*  PLT 123* 136*    Assessment/Plan: 27 y.o. [id number] postpartum day # 1  -Continue routine PP care -Lactation consult PRN for breastfeeding -Acute blood loss anemia - hemodynamically stable and asymptomatic; start PO ferrous sulfate BID with stool softeners  -Immunization status: Flu vaccine offered, Varicella vaccine offered, MMR offered  Disposition: Continue inpatient postpartum care.    LOS: 2 days   Lisette Grinder, CNM 06/04/2018, 11:16 AM   ----- Lisette Grinder Certified Nurse Midwife [facility]

## 2018-06-04 NOTE — Discharge Instructions (Signed)

## 2018-06-05 LAB — CBC
HCT: 29.1 % — ABNORMAL LOW (ref 35.0–47.0)
Hemoglobin: 9.8 g/dL — ABNORMAL LOW (ref 12.0–16.0)
MCH: 28 pg (ref 26.0–34.0)
MCHC: 33.6 g/dL (ref 32.0–36.0)
MCV: 83.4 fL (ref 80.0–100.0)
PLATELETS: 180 10*3/uL (ref 150–440)
RBC: 3.48 MIL/uL — ABNORMAL LOW (ref 3.80–5.20)
RDW: 14 % (ref 11.5–14.5)
WBC: 9.1 10*3/uL (ref 3.6–11.0)

## 2018-06-05 MED ORDER — IBUPROFEN 600 MG PO TABS: 600.0000 mg | ORAL_TABLET | Freq: Four times a day (QID) | ORAL | 0 refills | Status: DC | PRN

## 2018-06-05 MED ORDER — FERROUS SULFATE 325 (65 FE) MG PO TABS: 325.0000 mg | ORAL_TABLET | Freq: Two times a day (BID) | ORAL | 0 refills | Status: DC

## 2018-06-05 MED ORDER — ACETAMINOPHEN 325 MG PO TABS: 650.0000 mg | ORAL_TABLET | ORAL | 0 refills | Status: DC | PRN

## 2018-06-05 MED ORDER — OXYCODONE HCL 5 MG PO TABS: 5.0000 mg | ORAL_TABLET | ORAL | 0 refills | Status: DC | PRN

## 2018-06-05 NOTE — Lactation Note (Signed)
This note was copied from a baby's chart. Lactation Consultation Note  Patient Name: Gabriela Fuller [name] ZOXWR'U Date: 06/05/2018 Reason for consult: Follow-up assessment;Early term 37-38.6wks;Nipple pain/trauma   Maternal Data Formula Feeding for Exclusion: No Has patient been taught Hand Expression?: Yes Does the patient have breastfeeding experience prior to this delivery?: Yes  Feeding Feeding Type: Breast Fed  LATCH Score Latch: Grasps breast easily, tongue down, lips flanged, rhythmical sucking.  Audible Swallowing: A few with stimulation  Type of Nipple: Everted at rest and after stimulation  Comfort (Breast/Nipple): Filling, red/small blisters or bruises, mild/mod discomfort  Hold (Positioning): Assistance needed to correctly position infant at breast and maintain latch.  LATCH Score: 7  Interventions Interventions: Assisted with latch;Position options;Skin to skin;Breast compression;Breast massage;Adjust position  Lactation Tools Discussed/Used Tools: Nipple Shields Nipple shield size: 20 WIC Program: No(UHC)   Consult Status Consult Status: Follow-up Date: 06/05/18 Follow-up type: Call as needed    [name] 06/05/2018, 4:47 PM

## 2018-06-05 NOTE — Progress Notes (Signed)
Post Partum Day 2  Subjective: Doing well, no concerns for her. Ambulating without difficulty, pain managed with PO meds, tolerating regular diet, and voiding without difficulty.   No fever/chills, chest pain, shortness of breath, nausea/vomiting, or leg pain.   Having difficulties with breastfeeding, nipple soreness, and infant latch.   Objective: BP 116/67 (BP Location: Left Arm)   Pulse (!) 51   Temp 98.2 F (36.8 C) (Oral)   Resp 18   Ht _0  (1.702 m)   Wt 80.7 kg   LMP 09/09/2017   SpO2 100%   Breastfeeding? Unknown   BMI 27.88 kg/m    Physical Exam:  General: alert, cooperative, appears stated age and no distress CV: RRR Pulm: nl effort, CTABL Abdomen: soft, non-tender, active bowel sounds Uterine Fundus: firm Lochia: appropriate DVT Evaluation: No evidence of DVT seen on physical exam. No cords or calf tenderness. No significant calf/ankle edema.  Recent Labs    06/04/18 0423 06/05/18 0535  HGB 9.1* 9.8*  HCT 26.7* 29.1*  WBC 11.8* 9.1  PLT 136* 180    Assessment/Plan: 27 y.o. [id number] postpartum day # 2  -Continue routine PP care -Lactation consult PRN for breastfeeding -Acute blood loss anemia - hemodynamically stable and asymptomatic; continue PO ferrous sulfate BID with stool softeners.  -Immunization status: Flu vaccine offered, Varicella vaccine offered, MMR offered  Disposition: Continue inpatient postpartum care with plans for discharge later today.    LOS: 3 days   Lisette Grinder, CNM 06/05/2018, 9:58 AM   ----- Lisette Grinder Certified Nurse Midwife [facility]

## 2018-06-05 NOTE — Progress Notes (Signed)
Notified CNM of MMR and Varicella vaccines not given at discharge. Will need to order as out patient visit. Lisette Grinder, CNM notified. Patient called and notified as well.

## 2018-06-05 NOTE — Progress Notes (Signed)
Reviewed D/C instructions with pt and family. Pt verbalized understanding of teaching. Discharged to home via W/C. Pt to schedule f/u appt.  

## 2018-06-05 NOTE — Lactation Note (Signed)
This note was copied from a baby's chart. Lactation Consultation Note  Patient Name: Gabriela Fuller [id number] Date: 06/05/2018 Reason for consult: Follow-up assessment;Early term 37-38.6wks;Nipple pain/trauma;Difficult latch;Other (Comment)(Fusses a lot at breast - on & off breast & falls asleep at breast after a few sucks, but wants to be on breast continually) Mom tearful questioning why Theresia Fuller wants to be on breast continually but falls asleep after a few sucks.  When take him off the breast, he screams and sucks on whatever is near.  Nipples are extremely painful when [employer] and sucks.  Positional stripes on both nipples now.  Tried pumping without success b/c still too painful and was not getting any colostrum or mature milk.  Can tolerate leaving him at the breast with nipple shield.  His suck is disfunctional.  He has difficulty keeping a tight seal at breast or sucking on finger.  Continues to resort to a shallow latch.  He has a tendency to curl in top lip and sometimes lower lip.  No tongue tie noted, but could have slight lip tie.  She thought her older child had a lip tie, but the doctor disagreed.  Alternatives to bottles and formula discussed.  Mom wanting to give donor breast milk via SNS at the breast or finger feed instead of bottles or formula.  Assisted mom with breast feeding with DBM in SNS at the breast positioning comfortably sitting up in chair with pillow support and nursing stool under feet.  Theresia Fuller seems to be calmer when swaddled.  Placed him at breast in modified cradle hold with SNS tubing on top of #20 nipple shield.  He took 11 ml DBM before starting to struggle at breast coming on and off fussing, but not screaming when he came off like before.  Changed void and liquid meconium stool.  He was refusing to go back to the breast afterward, but did take 6 ml more of the DBM via finger feed using SNS.  Mom holding him after feeding and he is much calmer and not rooting  to suck.  Mom wants to continue with this plan of using SNS until mature milk comes in and is flowing faster.  Lactation name and number written on white board and encouraged to call with any questions, concerns or assistance. Maternal Data Formula Feeding for Exclusion: No Has patient been taught Hand Expression?: Yes Does the patient have breastfeeding experience prior to this delivery?: Yes  Feeding Feeding Type: Breast Fed  LATCH Score Latch: Repeated attempts needed to sustain latch, nipple held in mouth throughout feeding, stimulation needed to elicit sucking reflex.  Audible Swallowing: Spontaneous and intermittent  Type of Nipple: Everted at rest and after stimulation  Comfort (Breast/Nipple): Engorged, cracked, bleeding, large blisters, severe discomfort  Hold (Positioning): Assistance needed to correctly position infant at breast and maintain latch.  LATCH Score: 6  Interventions Interventions: Assisted with latch;DEBP;Breast compression;Coconut oil;Adjust position;Breast massage;Support pillows;Comfort gels;Position options  Lactation Tools Discussed/Used Tools: Nipple Shields;Pump;Supplemental Nutrition System;Coconut oil;Comfort gels Nipple shield size: 20 Breast pump type: Double-Electric Breast Pump WIC Program: No(UHC) Pump Review: Setup, frequency, and cleaning;Milk Storage;Other (comment) Initiated by:: S.Autry Prust,RN,BSN,IBCLC Date initiated:: 06/05/18   Consult Status Consult Status: Follow-up Date: 06/05/18 Follow-up type: Call as needed    Gabriela Fuller 06/05/2018, 11:01 AM

## 2018-06-05 NOTE — Lactation Note (Signed)
This note was copied from a baby's chart. Lactation Consultation Note  Patient Name: Gabriela Fuller [id number] Date: 06/05/2018 Reason for consult: Follow-up assessment;Early term 37-38.6wks;Mother's request;Difficult latch Assisted mom with last breast feeding using SNS before discharge letting mom and father of baby set up SNS.  [name] nursed on left breast using nipple shield without SNS for 15 minutes before becoming fussy.  Switched to right breast with SNS attached with 20 ml of DBM.  Towards end of feeding, [name] came off the breast and spit small amount and then went back with good rhythmic sucking taking all of 20 ml.  Lactation discharge questions answered.  Information given on community lactation resources.  Maternal Data Formula Feeding for Exclusion: No Has patient been taught Hand Expression?: Yes Does the patient have breastfeeding experience prior to this delivery?: Yes  Feeding Feeding Type: Breast Fed  LATCH Score Latch: Grasps breast easily, tongue down, lips flanged, rhythmical sucking.  Audible Swallowing: Spontaneous and intermittent  Type of Nipple: Everted at rest and after stimulation  Comfort (Breast/Nipple): Filling, red/small blisters or bruises, mild/mod discomfort  Hold (Positioning): Assistance needed to correctly position infant at breast and maintain latch.  LATCH Score: 8  Interventions Interventions: Assisted with latch;Position options;Reverse pressure;Breast compression;Breast massage;Adjust position;Coconut oil;Comfort gels;Support pillows  Lactation Tools Discussed/Used Tools: Nipple Shields Nipple shield size: 20 WIC Program: No   Consult Status Consult Status: PRN Follow-up type: Call as needed    [name] 06/05/2018, 5:53 PM

## 2018-06-17 NOTE — Anesthesia Postprocedure Evaluation (Signed)
Anesthesia Post Note  Patient: Gabriela Fuller  Procedure(s) Performed: AN AD HOC LABOR EPIDURAL  Comments: Pt discharged prior to being seen.     Last Vitals: There were no vitals filed for this visit.  Last Pain: There were no vitals filed for this visit.               KEPHART,WILLIAM K

## 2018-10-02 IMAGING — US US PELVIS COMPLETE
1 series · 14 of 25 positions shown · non-contrast
Comparison: None.

CLINICAL DATA: Postpartum bleeding and fever

EXAM:
TRANSABDOMINAL ULTRASOUND OF PELVIS
TECHNIQUE: Transabdominal ultrasound examination of the pelvis was performed
including evaluation of the uterus, ovaries, adnexal regions, and
pelvic cul-de-sac.

[Series 1: us pelvis complete · 0.22mm/px · 14 of 70 slices shown]
[im 1/70]
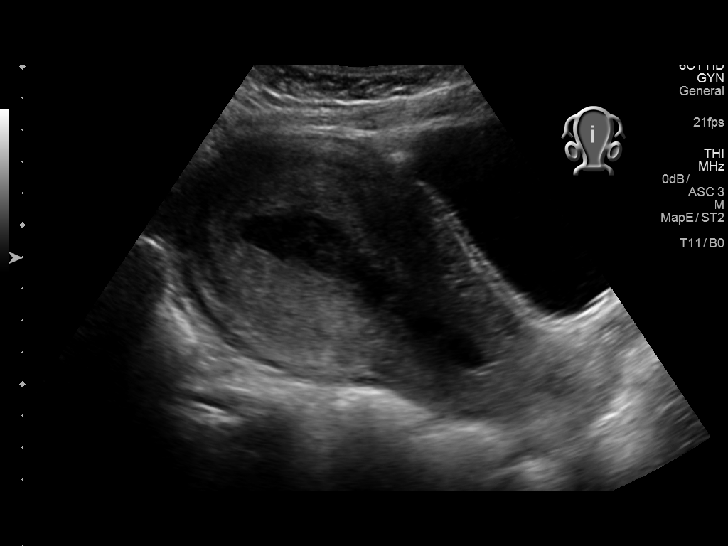
[im 6/70]
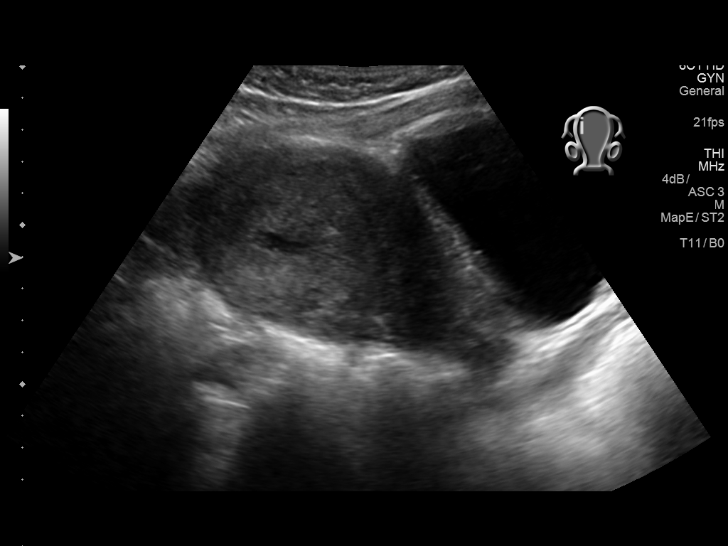
[im 12/70]
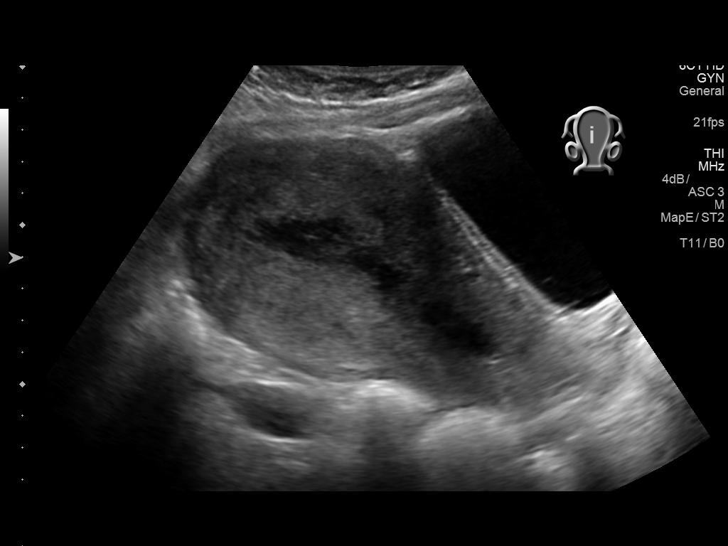
[im 18/70]
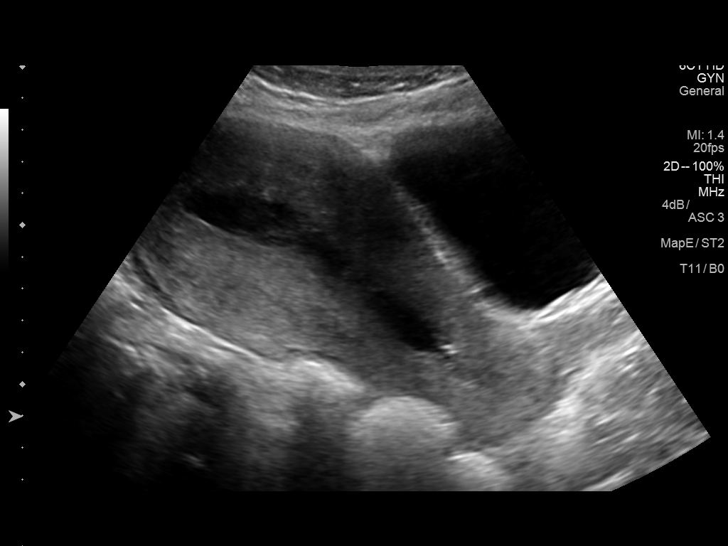
[im 24/70]
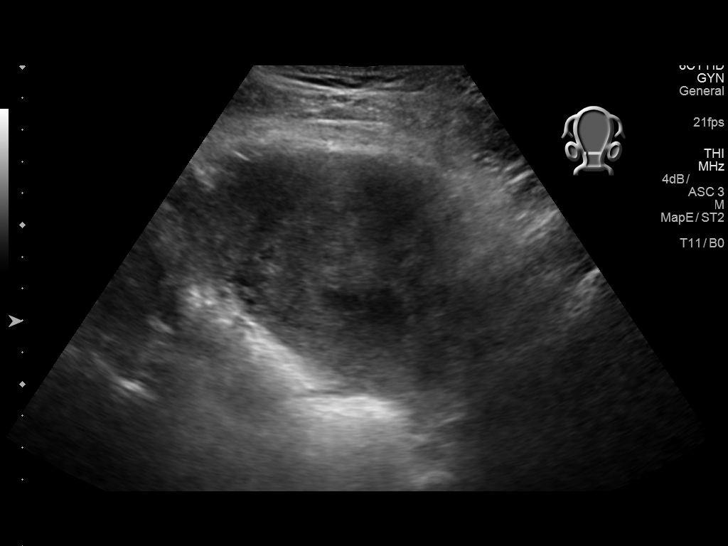
[im 26/70]
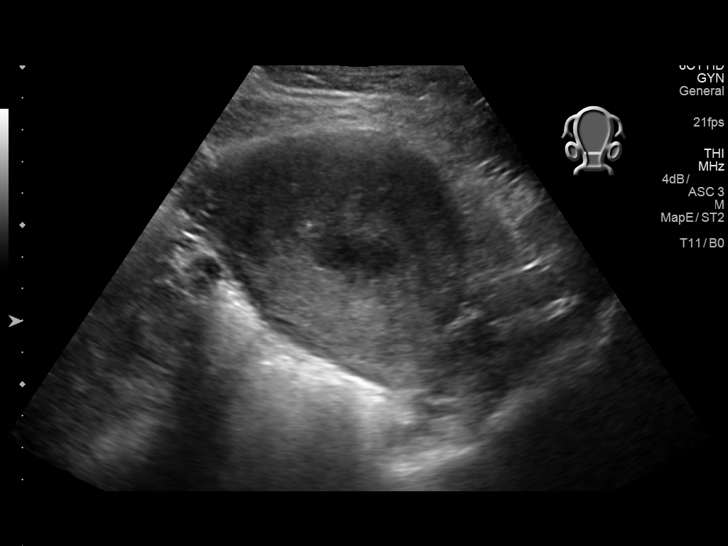
[im 32/70]
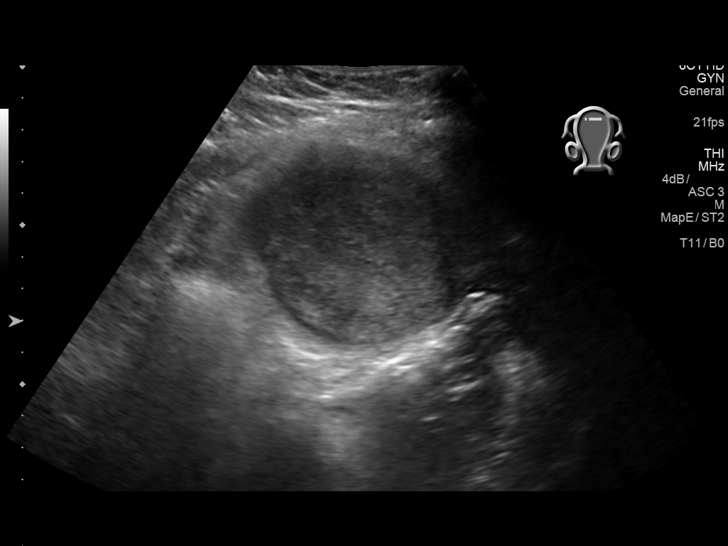
[im 38/70]
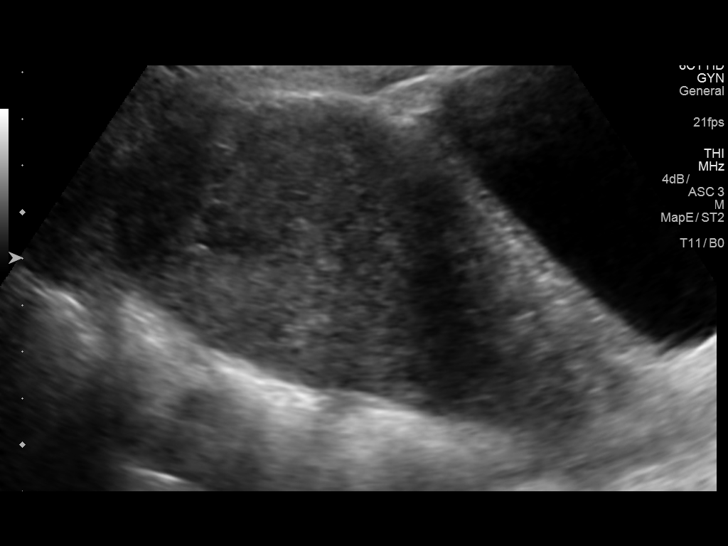
[im 44/70]
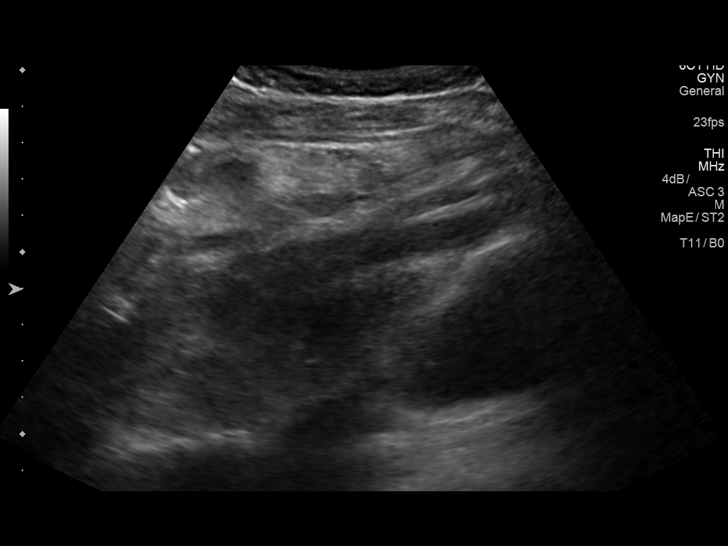
[im 47/70]
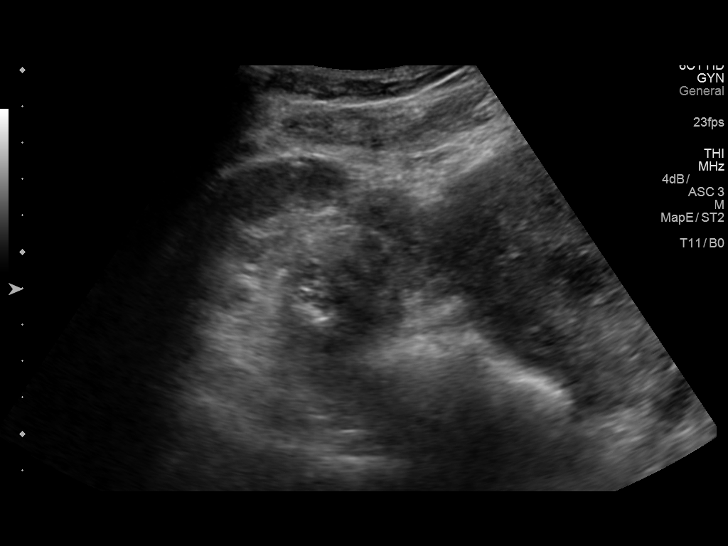
[im 52/70]
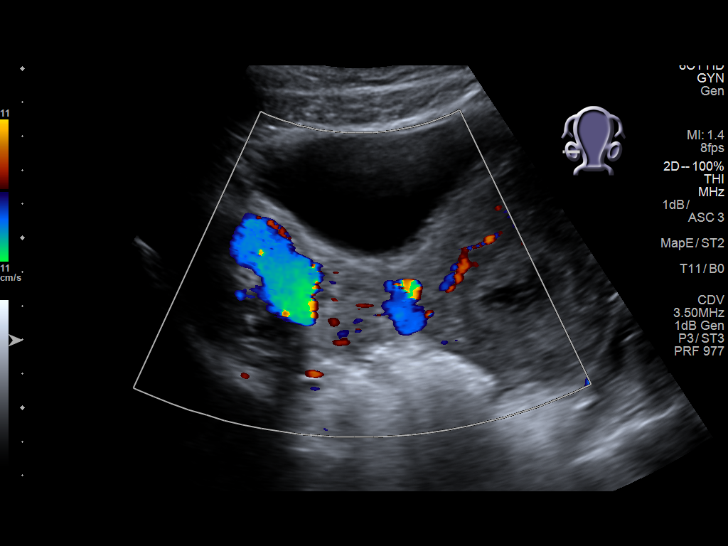
[im 58/70]
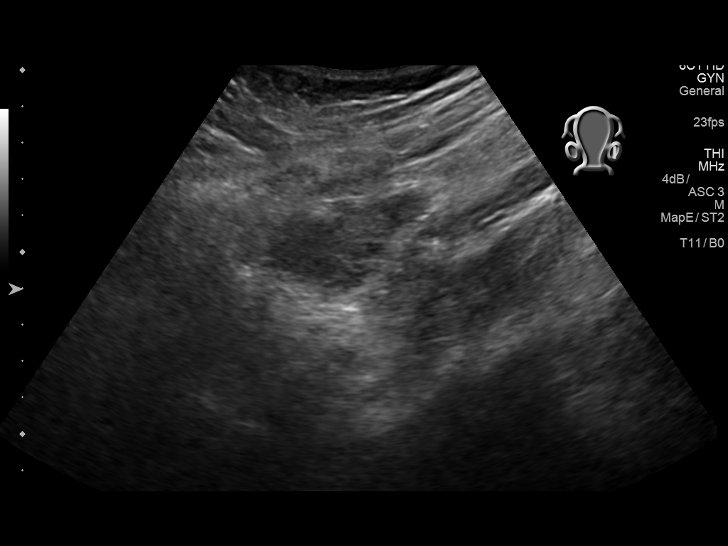
[im 64/70]
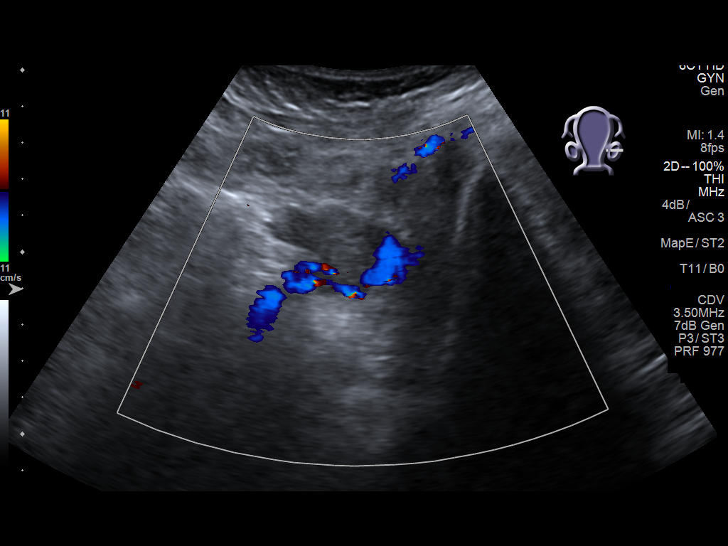
[im 70/70]
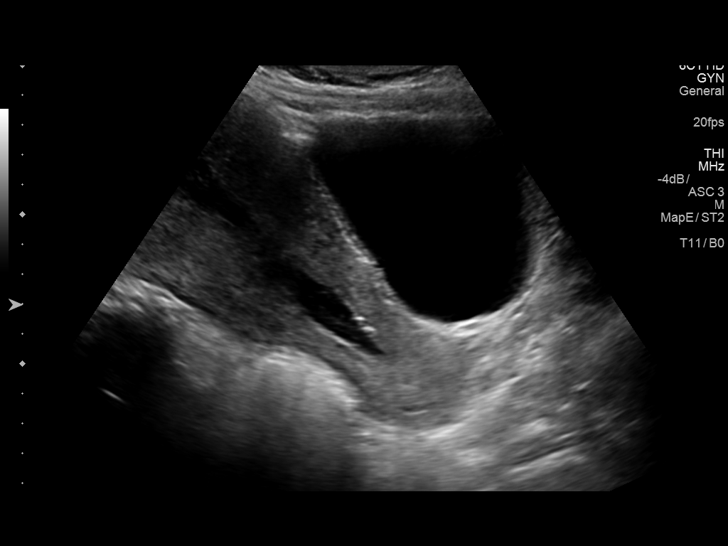

[14 of 25 positions shown; findings below may reference images not displayed]

FINDINGS: Uterus

Measurements: 14.6 x 7.4 x 9.2 cm.. No fibroids are seen. The uterus
is enlarged consistent with postpartum state.

Endometrium

The endometrial cavity is widened to 1.9 cm with diffuse decreased
echogenicity within. This likely represents thrombus given the
clinical history. No increased vascularity to suggest retained
products of conception are noted.

Right ovary

Measurements: 4.2 x 2.2 x 2.1 cm.. Normal appearance/no adnexal
mass.

Left ovary

Measurements: 3.9 x 2.4 x 3.1 cm.. Normal appearance/no adnexal
mass.

Other findings:  No abnormal free fluid.
IMPRESSION: Widened endometrial canal with diffuse hypoechoic material within
likely representing thrombus. No increased vascularity to suggest
retained products of conception is seen.

The uterus is enlarged consistent with postpartum state.

## 2019-09-01 NOTE — L&D Delivery Note (Signed)
Delivery Note  Gabriela Fuller is a [id number] at [redacted]w[redacted]d with an LMP of 09/20/2019, consistent with [place] at [redacted]w[redacted]d.   First Stage: Labor onset: 1415 Induction: misoprostol, oxytocin Analgesia /Anesthesia intrapartum: Epidural SROM at 1720 for clear fluid, terminal meconium noted at birth   Second Stage: Complete dilation at 2011 Onset of pushing at 2008 FHR second stage 145bpm with moderate variability and recurrent variable decelerations    Gabriela Fuller presented to L&D for elective IOL at term.  Labor was induced with misoprostol followed by oxytocin.  Her contractions became more painful and she SROM'd after her epidural placement.  Recurrent variable decelerations were noted during transition.  Intrauterine resuscitation measures were utilized to include position changes, amnioinfusion, and oxytocin was discontinued.  She progressed to C/C/+2 spontaneously with urge to push.  Variables improved, though some were still noted with pushing.  She pushed well over 17 minutes for spontaneous birth of baby boy "Waylin".  Delivery of a viable baby boy on 06/20/20 at 2025 by CNM Delivery of fetal head in OA position with restitution to LOT.  Nuchal cord x1 noted;  Anterior then posterior shoulders delivered easily with gentle downward traction. Infant somersaulted through nuchal cord to maternal right thigh, cord reduced. Baby placed on mom's chest, and attended to by baby RN Cord double clamped after cessation of pulsation, cut by father of baby.   Cord blood sample collected: O pos   Third Stage: Oxytocin 10 units IM injection given after delivery of infant for PPH prophylaxis   Placenta delivered intact with 3 VC @ 2034 Placenta disposition: discarded Uterine tone firm / bleeding moderate   No laceration identified  Anesthesia for repair: N/A Repair: N/A Est. Blood Loss (mL):  Complications: Terminal Meconium  Mom to postpartum.  Baby to Couplet care / Skin to  Skin.  Newborn: Information for the patient's newborn:  Gabriela Fuller, Gabriela Fuller [[id number]]  Live born female "Waylin" Birth Weight: 7 lb 13.2 oz (3550 g) APGAR: 8, 9  Newborn Delivery   Birth date/time: 06/20/2020 20:25:00 Delivery type: Vaginal, Spontaneous     Feeding planned: Breast   ---------- Margaretmary Eddy, CNM Certified Nurse Midwife [facility]

## 2019-12-29 IMAGING — US US ABDOMEN LIMITED
1 series · 14 of 25 positions shown · non-contrast
Comparison: 09/02/2017.

CLINICAL DATA: Upper abdominal pain.  Pregnancy.

EXAM:
ULTRASOUND ABDOMEN LIMITED RIGHT UPPER QUADRANT

[Series 1: us abdomen limited · 0.15mm/px · 14 of 62 slices shown]
[im 1/62]
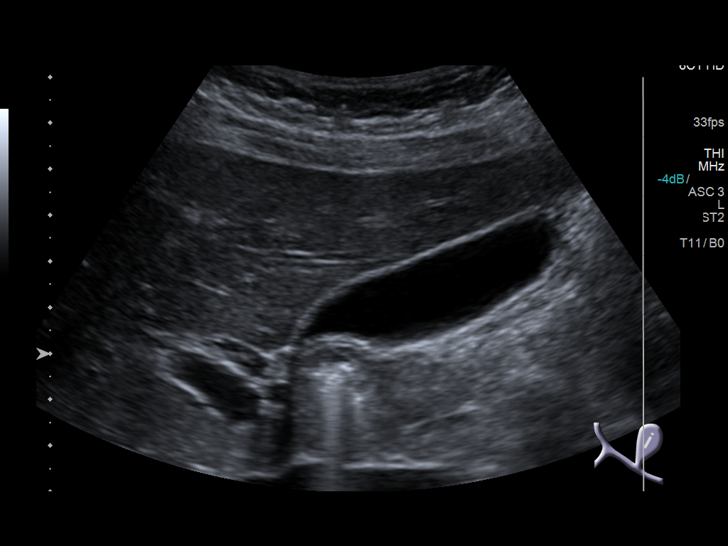
[im 6/62]
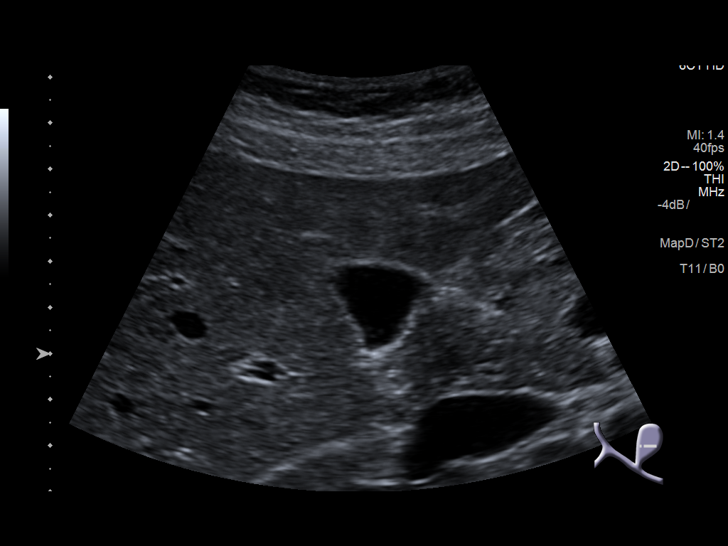
[im 11/62]
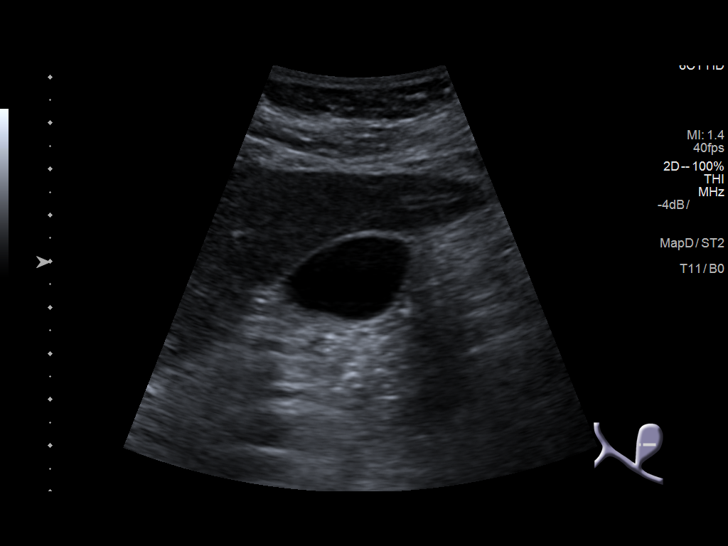
[im 16/62]
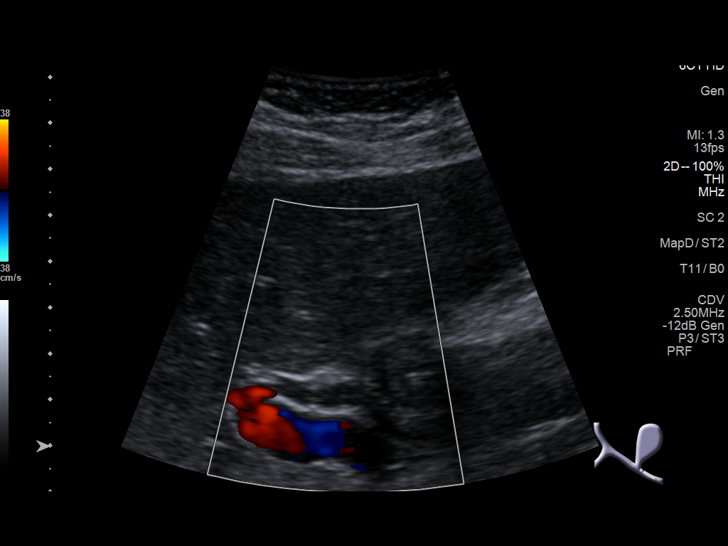
[im 21/62]
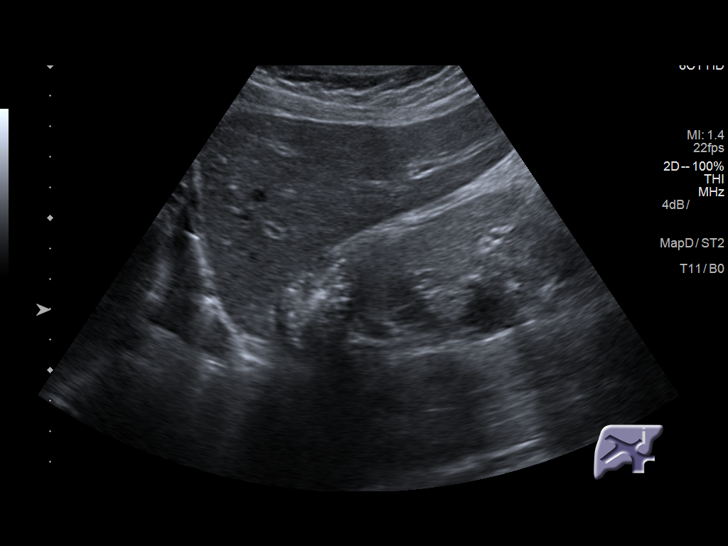
[im 23/62]
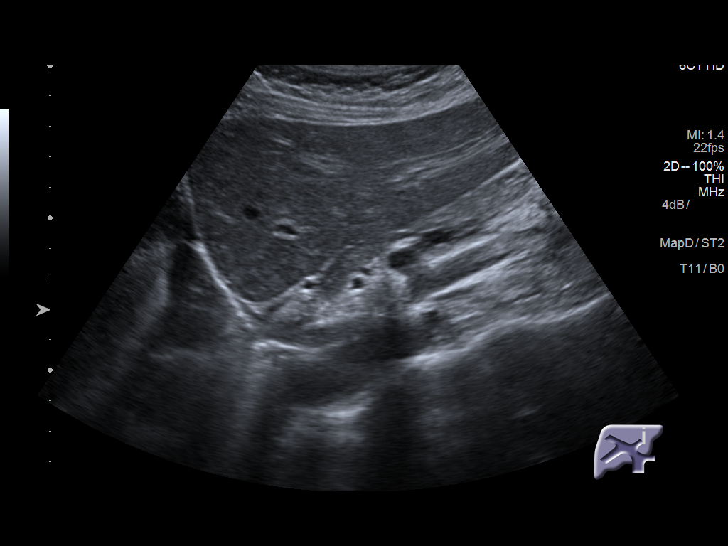
[im 28/62]
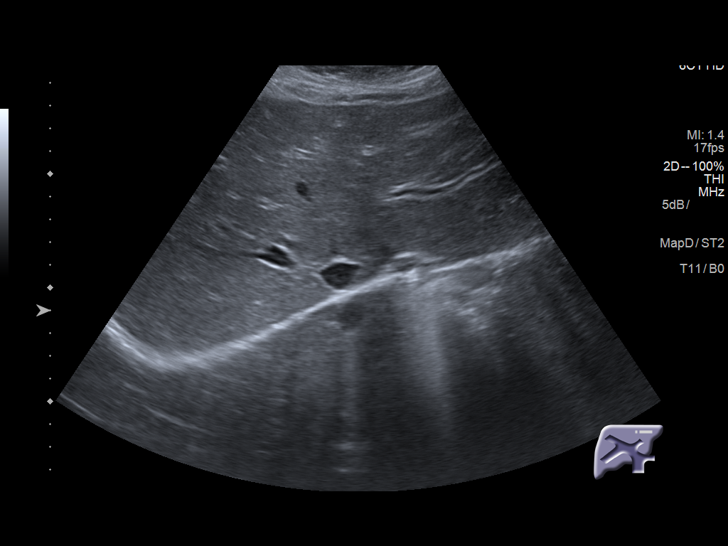
[im 34/62]
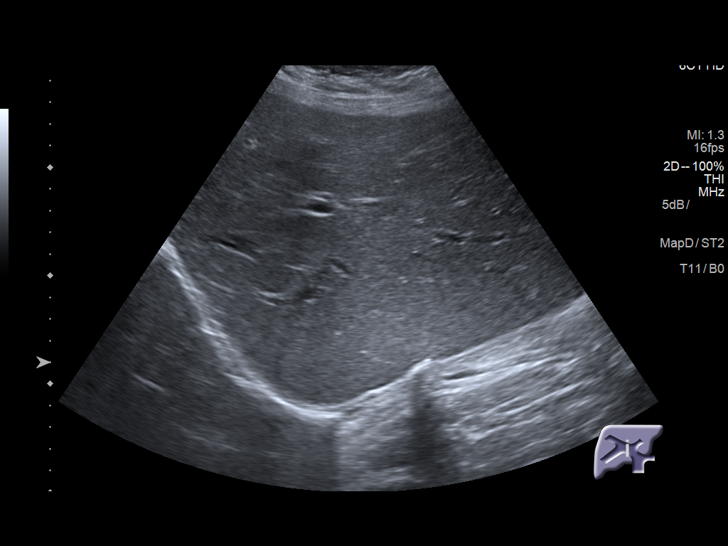
[im 39/62]
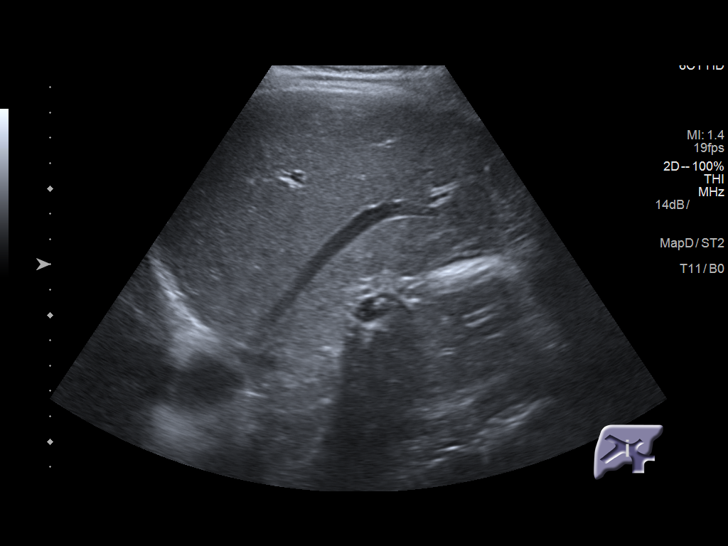
[im 41/62]
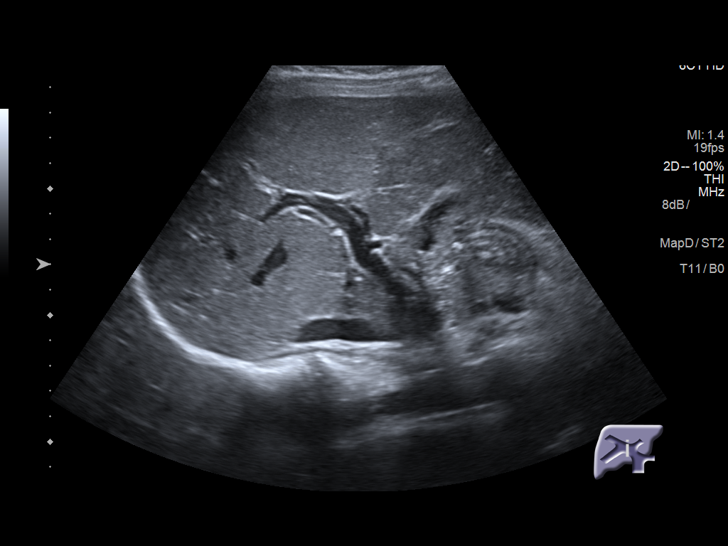
[im 46/62]
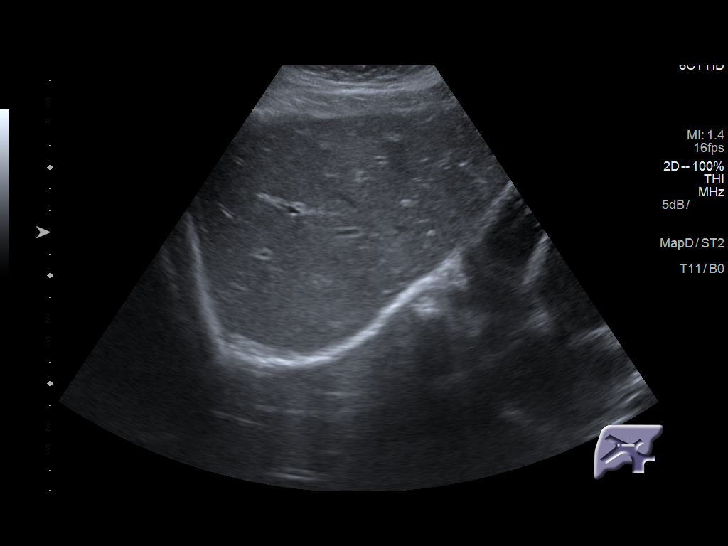
[im 51/62]
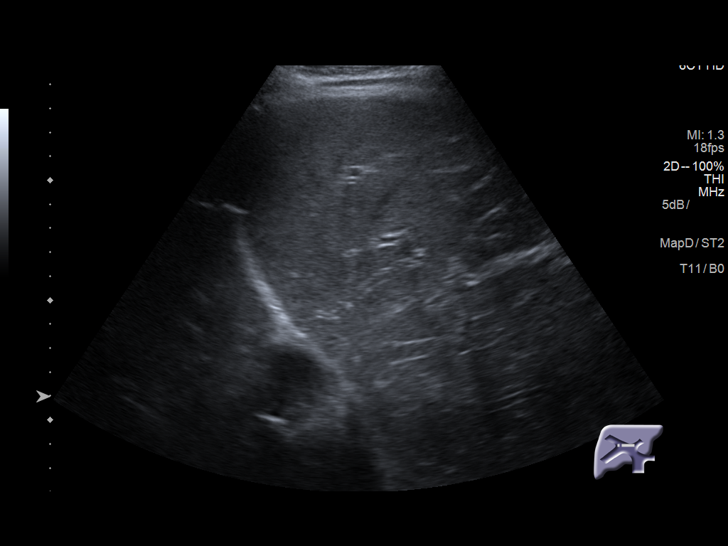
[im 56/62]
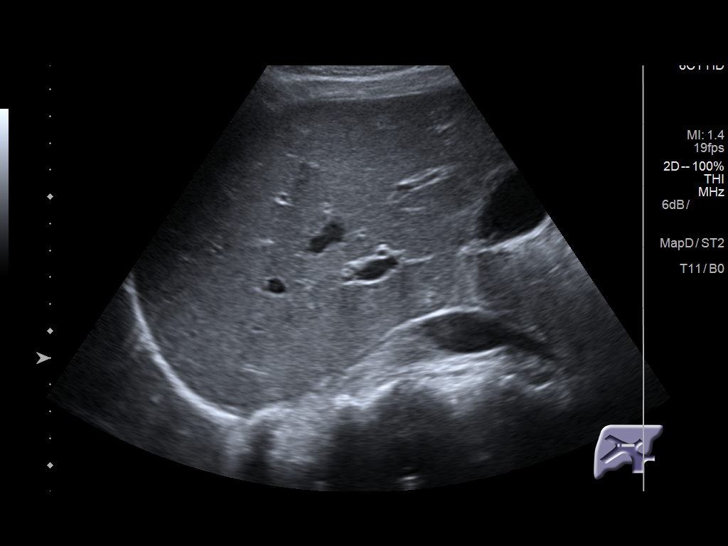
[im 62/62]
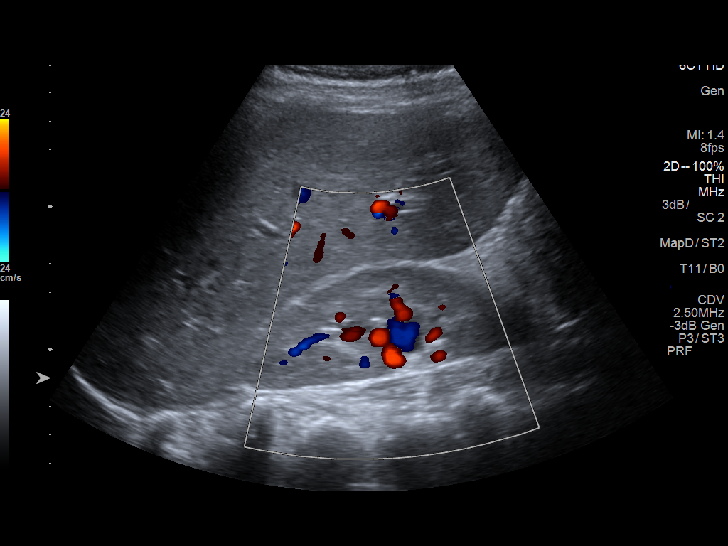

[14 of 25 positions shown; findings below may reference images not displayed]

FINDINGS: Gallbladder:

No gallstones or wall thickening visualized. No sonographic Murphy
sign noted by sonographer.

Common bile duct:

Diameter: 3.8 mm

Liver:

No focal lesion identified. Within normal limits in parenchymal
echogenicity. Portal vein is patent on color Doppler imaging with
normal direction of blood flow towards the liver.
IMPRESSION: No acute or focal abnormality.

## 2020-01-22 DIAGNOSIS — Z3483 Encounter for supervision of other normal pregnancy, third trimester: Secondary | ICD-10-CM | POA: Insufficient documentation

## 2020-01-22 DIAGNOSIS — Z3490 Encounter for supervision of normal pregnancy, unspecified, unspecified trimester: Secondary | ICD-10-CM | POA: Insufficient documentation

## 2020-01-26 LAB — OB RESULTS CONSOLE RUBELLA ANTIBODY, IGM: Rubella: NON-IMMUNE/NOT IMMUNE

## 2020-01-26 LAB — OB RESULTS CONSOLE HEPATITIS B SURFACE ANTIGEN: Hepatitis B Surface Ag: NEGATIVE

## 2020-01-26 LAB — OB RESULTS CONSOLE VARICELLA ZOSTER ANTIBODY, IGG: Varicella: NON-IMMUNE/NOT IMMUNE

## 2020-01-26 LAB — OB RESULTS CONSOLE GC/CHLAMYDIA
Chlamydia: NEGATIVE
Gonorrhea: NEGATIVE

## 2020-01-26 LAB — OB RESULTS CONSOLE HIV ANTIBODY (ROUTINE TESTING): HIV: NONREACTIVE

## 2020-05-29 LAB — OB RESULTS CONSOLE GBS
GBS: NEGATIVE
GBS: NEGATIVE
GBS: NEGATIVE

## 2020-06-05 LAB — OB RESULTS CONSOLE RPR: RPR: NONREACTIVE

## 2020-06-13 ENCOUNTER — Other Ambulatory Visit: Payer: Self-pay

## 2020-06-13 NOTE — Progress Notes (Signed)
[id number] at [redacted]w[redacted]d, LMP of 09/20/2019, c/w early [place] at [redacted]w[redacted]d.  Scheduled for induction of labor for elective at term on 06/25/2020.   Prenatal provider: [facility] Pregnancy complicated by: 1. Varicella non-immune 2. Rubella non-immune 3. Physiologic anemia of pregnancy    Prenatal Labs: Blood type/Rh O pos   Antibody screen neg  Rubella Non-Immune  Varicella Non-Immune  RPR NR  HBsAg Neg  HIV NR  GC neg  Chlamydia neg  Genetic screening negative  1 hour GTT 102  3 hour GTT N/A  GBS Neg    Tdap: given 04/09/2020 Flu: due for current season Contraception: TBD Feeding preference: breast   ____ Gabriela Fuller, CNM Certified Nurse Midwife [facility]

## 2020-06-19 ENCOUNTER — Encounter: Payer: Self-pay | Admitting: Certified Nurse Midwife

## 2020-06-19 ENCOUNTER — Other Ambulatory Visit: Payer: Self-pay

## 2020-06-19 ENCOUNTER — Other Ambulatory Visit: Payer: Self-pay | Admitting: Certified Nurse Midwife

## 2020-06-19 ENCOUNTER — Other Ambulatory Visit
Admission: RE | Admit: 2020-06-19 | Discharge: 2020-06-19 | Disposition: A | Discharge status: Home / Self Care | Payer: Medicaid Other | Source: Ambulatory Visit | Attending: Obstetrics and Gynecology | Admitting: Obstetrics and Gynecology

## 2020-06-19 DIAGNOSIS — Z01818 Encounter for other preprocedural examination: Secondary | ICD-10-CM | POA: Insufficient documentation

## 2020-06-19 DIAGNOSIS — Z20822 Contact with and (suspected) exposure to covid-19: Secondary | ICD-10-CM | POA: Insufficient documentation

## 2020-06-20 ENCOUNTER — Inpatient Hospital Stay: Payer: Medicaid Other | Admitting: Registered Nurse

## 2020-06-20 ENCOUNTER — Encounter: Payer: Self-pay | Admitting: Obstetrics and Gynecology

## 2020-06-20 ENCOUNTER — Inpatient Hospital Stay
Admission: RE | Admit: 2020-06-20 | Discharge: 2020-06-21 | DRG: 806 | Disposition: A | Discharge status: Home / Self Care | Payer: Medicaid Other

## 2020-06-20 DIAGNOSIS — Z20822 Contact with and (suspected) exposure to covid-19: Secondary | ICD-10-CM | POA: Diagnosis present

## 2020-06-20 DIAGNOSIS — O9081 Anemia of the puerperium: Secondary | ICD-10-CM | POA: Diagnosis not present

## 2020-06-20 DIAGNOSIS — Z3A39 39 weeks gestation of pregnancy: Secondary | ICD-10-CM

## 2020-06-20 DIAGNOSIS — Z3490 Encounter for supervision of normal pregnancy, unspecified, unspecified trimester: Secondary | ICD-10-CM | POA: Diagnosis present

## 2020-06-20 DIAGNOSIS — O26893 Other specified pregnancy related conditions, third trimester: Secondary | ICD-10-CM | POA: Diagnosis present

## 2020-06-20 DIAGNOSIS — D62 Acute posthemorrhagic anemia: Secondary | ICD-10-CM

## 2020-06-20 LAB — CBC
HCT: 31 % — ABNORMAL LOW (ref 36.0–46.0)
Hemoglobin: 10.4 g/dL — ABNORMAL LOW (ref 12.0–15.0)
MCH: 28.5 pg (ref 26.0–34.0)
MCHC: 33.5 g/dL (ref 30.0–36.0)
MCV: 84.9 fL (ref 80.0–100.0)
Platelets: 159 10*3/uL (ref 150–400)
RBC: 3.65 MIL/uL — ABNORMAL LOW (ref 3.87–5.11)
RDW: 13 % (ref 11.5–15.5)
WBC: 9.1 10*3/uL (ref 4.0–10.5)
nRBC: 0 % (ref 0.0–0.2)

## 2020-06-20 LAB — TYPE AND SCREEN
ABO/RH(D): O POS
Antibody Screen: NEGATIVE

## 2020-06-20 LAB — SARS CORONAVIRUS 2 (TAT 6-24 HRS): SARS Coronavirus 2: NEGATIVE

## 2020-06-20 LAB — RPR: RPR Ser Ql: NONREACTIVE

## 2020-06-20 MED ORDER — PHENYLEPHRINE 40 MCG/ML (10ML) SYRINGE FOR IV PUSH (FOR BLOOD PRESSURE SUPPORT): 80.0000 ug | PREFILLED_SYRINGE | INTRAVENOUS | Status: DC | PRN

## 2020-06-20 MED ORDER — MISOPROSTOL 25 MCG QUARTER TABLET
25.0000 ug | ORAL_TABLET | ORAL | Status: DC | PRN
  Administered 2020-06-20: 25 ug via VAGINAL
  Filled 2020-06-20: qty 1

## 2020-06-20 MED ORDER — BENZOCAINE-MENTHOL 20-0.5 % EX AERO
1.0000 "application " | INHALATION_SPRAY | CUTANEOUS | Status: DC | PRN
  Filled 2020-06-20: qty 56

## 2020-06-20 MED ORDER — LACTATED RINGERS AMNIOINFUSION
INTRAVENOUS | Status: DC
  Filled 2020-06-20: qty 1000

## 2020-06-20 MED ORDER — TERBUTALINE SULFATE 1 MG/ML IJ SOLN: 0.2500 mg | Freq: Once | INTRAMUSCULAR | Status: DC | PRN

## 2020-06-20 MED ORDER — ONDANSETRON HCL 4 MG/2ML IJ SOLN: 4.0000 mg | INTRAMUSCULAR | Status: DC | PRN

## 2020-06-20 MED ORDER — FENTANYL 2.5 MCG/ML W/ROPIVACAINE 0.15% IN NS 100 ML EPIDURAL (ARMC)
EPIDURAL | Status: AC
  Filled 2020-06-20: qty 100

## 2020-06-20 MED ORDER — LACTATED RINGERS IV SOLN: INTRAVENOUS | Status: DC

## 2020-06-20 MED ORDER — FENTANYL 2.5 MCG/ML W/ROPIVACAINE 0.15% IN NS 100 ML EPIDURAL (ARMC): 12.0000 mL/h | EPIDURAL | Status: DC

## 2020-06-20 MED ORDER — DIPHENHYDRAMINE HCL 25 MG PO CAPS
25.0000 mg | ORAL_CAPSULE | Freq: Four times a day (QID) | ORAL | Status: DC | PRN
  Administered 2020-06-20: 25 mg via ORAL
  Filled 2020-06-20: qty 1

## 2020-06-20 MED ORDER — DIPHENHYDRAMINE HCL 50 MG/ML IJ SOLN: 12.5000 mg | INTRAMUSCULAR | Status: DC | PRN

## 2020-06-20 MED ORDER — SODIUM CHLORIDE 0.9% FLUSH: 3.0000 mL | INTRAVENOUS | Status: DC | PRN

## 2020-06-20 MED ORDER — OXYTOCIN-SODIUM CHLORIDE 30-0.9 UT/500ML-% IV SOLN: 1.0000 m[IU]/min | INTRAVENOUS | Status: DC

## 2020-06-20 MED ORDER — LACTATED RINGERS IV SOLN: 500.0000 mL | INTRAVENOUS | Status: DC | PRN

## 2020-06-20 MED ORDER — LIDOCAINE HCL (PF) 1 % IJ SOLN: 30.0000 mL | INTRAMUSCULAR | Status: DC | PRN

## 2020-06-20 MED ORDER — EPHEDRINE 5 MG/ML INJ: 10.0000 mg | INTRAVENOUS | Status: DC | PRN

## 2020-06-20 MED ORDER — MISOPROSTOL 25 MCG QUARTER TABLET: 25.0000 ug | ORAL_TABLET | ORAL | Status: DC | PRN

## 2020-06-20 MED ORDER — OXYTOCIN 10 UNIT/ML IJ SOLN
10.0000 [IU] | Freq: Once | INTRAMUSCULAR | Status: AC | PRN
  Administered 2020-06-20: 10 [IU] via INTRAMUSCULAR

## 2020-06-20 MED ORDER — LIDOCAINE-EPINEPHRINE (PF) 1.5 %-1:200000 IJ SOLN
INTRAMUSCULAR | Status: DC | PRN
  Administered 2020-06-20: 3 mL via EPIDURAL

## 2020-06-20 MED ORDER — WITCH HAZEL-GLYCERIN EX PADS: 1.0000 "application " | MEDICATED_PAD | CUTANEOUS | Status: DC

## 2020-06-20 MED ORDER — OXYTOCIN 10 UNIT/ML IJ SOLN
INTRAMUSCULAR | Status: AC
  Filled 2020-06-20: qty 2

## 2020-06-20 MED ORDER — LIDOCAINE HCL (PF) 1 % IJ SOLN
INTRAMUSCULAR | Status: DC | PRN
  Administered 2020-06-20: 3 mL via SUBCUTANEOUS

## 2020-06-20 MED ORDER — FENTANYL 2.5 MCG/ML W/ROPIVACAINE 0.15% IN NS 100 ML EPIDURAL (ARMC)
EPIDURAL | Status: DC | PRN
  Administered 2020-06-20: 12 mL/h via EPIDURAL

## 2020-06-20 MED ORDER — MISOPROSTOL 25 MCG QUARTER TABLET
25.0000 ug | ORAL_TABLET | ORAL | Status: DC | PRN
  Administered 2020-06-20: 25 ug via VAGINAL

## 2020-06-20 MED ORDER — ACETAMINOPHEN 500 MG PO TABS: 1000.0000 mg | ORAL_TABLET | Freq: Four times a day (QID) | ORAL | Status: DC | PRN

## 2020-06-20 MED ORDER — PRENATAL MULTIVITAMIN CH
1.0000 | ORAL_TABLET | Freq: Every day | ORAL | Status: DC
  Administered 2020-06-21: 1 via ORAL
  Filled 2020-06-20: qty 1

## 2020-06-20 MED ORDER — ONDANSETRON HCL 4 MG/2ML IJ SOLN
4.0000 mg | Freq: Four times a day (QID) | INTRAMUSCULAR | Status: DC | PRN
  Administered 2020-06-20: 4 mg via INTRAVENOUS
  Filled 2020-06-20: qty 2

## 2020-06-20 MED ORDER — OXYTOCIN-SODIUM CHLORIDE 30-0.9 UT/500ML-% IV SOLN: 2.5000 [IU]/h | INTRAVENOUS | Status: DC

## 2020-06-20 MED ORDER — LIDOCAINE-EPINEPHRINE (PF) 1.5 %-1:200000 IJ SOLN: INTRAMUSCULAR | Status: DC | PRN

## 2020-06-20 MED ORDER — CALCIUM CARBONATE ANTACID 500 MG PO CHEW: 200.0000 mg | CHEWABLE_TABLET | Freq: Once | ORAL | Status: DC

## 2020-06-20 MED ORDER — IBUPROFEN 600 MG PO TABS
600.0000 mg | ORAL_TABLET | Freq: Four times a day (QID) | ORAL | Status: DC
  Administered 2020-06-20 – 2020-06-21 (×4): 600 mg via ORAL
  Filled 2020-06-20 (×4): qty 1

## 2020-06-20 MED ORDER — BUTORPHANOL TARTRATE 1 MG/ML IJ SOLN: 1.0000 mg | INTRAMUSCULAR | Status: DC | PRN

## 2020-06-20 MED ORDER — MISOPROSTOL 25 MCG QUARTER TABLET
ORAL_TABLET | ORAL | Status: AC
  Administered 2020-06-20: 25 ug via BUCCAL
  Filled 2020-06-20: qty 2

## 2020-06-20 MED ORDER — DOCUSATE SODIUM 100 MG PO CAPS
100.0000 mg | ORAL_CAPSULE | Freq: Two times a day (BID) | ORAL | Status: DC
  Administered 2020-06-21 (×2): 100 mg via ORAL
  Filled 2020-06-20 (×2): qty 1

## 2020-06-20 MED ORDER — ONDANSETRON HCL 4 MG PO TABS
4.0000 mg | ORAL_TABLET | ORAL | Status: DC | PRN
  Filled 2020-06-20: qty 1

## 2020-06-20 MED ORDER — MISOPROSTOL 25 MCG QUARTER TABLET
25.0000 ug | ORAL_TABLET | ORAL | Status: DC | PRN
  Administered 2020-06-20: 25 ug via BUCCAL
  Filled 2020-06-20: qty 1

## 2020-06-20 MED ORDER — SIMETHICONE 80 MG PO CHEW: 80.0000 mg | CHEWABLE_TABLET | ORAL | Status: DC | PRN

## 2020-06-20 MED ORDER — SODIUM CHLORIDE 0.9% FLUSH: 3.0000 mL | Freq: Two times a day (BID) | INTRAVENOUS | Status: DC

## 2020-06-20 MED ORDER — AMMONIA AROMATIC IN INHA
RESPIRATORY_TRACT | Status: AC
  Filled 2020-06-20: qty 10

## 2020-06-20 MED ORDER — OXYTOCIN-SODIUM CHLORIDE 30-0.9 UT/500ML-% IV SOLN
INTRAVENOUS | Status: AC
  Administered 2020-06-20: 2 m[IU]/min via INTRAVENOUS
  Filled 2020-06-20: qty 500

## 2020-06-20 MED ORDER — MISOPROSTOL 200 MCG PO TABS
ORAL_TABLET | ORAL | Status: AC
  Filled 2020-06-20: qty 4

## 2020-06-20 MED ORDER — SOD CITRATE-CITRIC ACID 500-334 MG/5ML PO SOLN: 30.0000 mL | ORAL | Status: DC | PRN

## 2020-06-20 MED ORDER — OXYTOCIN BOLUS FROM INFUSION: 333.0000 mL | Freq: Once | INTRAVENOUS | Status: DC

## 2020-06-20 MED ORDER — CALCIUM CARBONATE ANTACID 500 MG PO CHEW: 400.0000 mg | CHEWABLE_TABLET | ORAL | Status: DC | PRN

## 2020-06-20 MED ORDER — SODIUM CHLORIDE 0.9 % IV SOLN: 250.0000 mL | INTRAVENOUS | Status: DC | PRN

## 2020-06-20 MED ORDER — LACTATED RINGERS IV SOLN
500.0000 mL | Freq: Once | INTRAVENOUS | Status: AC
  Administered 2020-06-20: 500 mL via INTRAVENOUS

## 2020-06-20 MED ORDER — BUPIVACAINE HCL (PF) 0.25 % IJ SOLN
INTRAMUSCULAR | Status: DC | PRN
  Administered 2020-06-20 (×2): 5 mL via EPIDURAL

## 2020-06-20 MED ORDER — DIBUCAINE (PERIANAL) 1 % EX OINT: 1.0000 "application " | TOPICAL_OINTMENT | CUTANEOUS | Status: DC | PRN

## 2020-06-20 MED ORDER — COCONUT OIL OIL: 1.0000 "application " | TOPICAL_OIL | Status: DC | PRN

## 2020-06-20 MED ORDER — ACETAMINOPHEN 500 MG PO TABS
1000.0000 mg | ORAL_TABLET | Freq: Four times a day (QID) | ORAL | Status: DC | PRN
  Administered 2020-06-20 – 2020-06-21 (×4): 1000 mg via ORAL
  Filled 2020-06-20 (×4): qty 2

## 2020-06-20 MED ORDER — LIDOCAINE HCL (PF) 1 % IJ SOLN
INTRAMUSCULAR | Status: AC
  Filled 2020-06-20: qty 30

## 2020-06-20 NOTE — Progress Notes (Signed)
Labor Progress Note  Gabriela Fuller is a 29 y.o. [id number] at [redacted]w[redacted]d by LMP admitted for induction of labor due to Elective at term.  Subjective: comfortable with epidural, starting to feel intermittent pressure   Objective: BP (!) 94/59   Pulse 82   Temp 97.9 F (36.6 C) (Oral)   Resp 16   Ht 5\' 7"  (1.702 m)   Wt 83.9 kg   LMP 09/20/2019   SpO2 100%   BMI 28.98 kg/m  Notable VS details: reviewed   Fetal Assessment: FHT:  FHR: 135 bpm, variability: moderate,  accelerations:  Present,  decelerations:  Absent Category/reactivity:  Category I UC:   regular, every 2-4 minutes SVE:   4-5/80/-1  Membrane status: SROM at 1720, Forebag AROM'd, change in fetal station from -1 to 0 Amniotic color: Clear   Labs: Lab Results  Component Value Date   WBC 9.1 06/20/2020   HGB 10.4 (L) 06/20/2020   HCT 31.0 (L) 06/20/2020   MCV 84.9 06/20/2020   PLT 159 06/20/2020    Assessment / Plan: Induction of labor due to elective at term,  progressing well on pitocin  Labor: Progressing normally Preeclampsia:  no s/s Fetal Wellbeing:  Category I Pain Control:  Epidural I/D:  n/a  06/22/2020, CNM 06/20/2020, 6:27 PM

## 2020-06-20 NOTE — H&P (Signed)
OB History & Physical   History of Present Illness:  Chief Complaint: presents for scheduled induction  HPI:  Gabriela Fuller is a 29 y.o. [phone] female at [redacted]w[redacted]d dated by LMP c/w [redacted]w[redacted]d ultrasound.  She presents to L&D for scheduled induction of labor  She reports:  -active fetal movement -no leakage of fluid  -no vaginal bleeding -irregular contractions   Pregnancy Issues: 1. Varicella non-immune 2. Rubella non-immune 3. Iron deficiency anemia   Maternal Medical History:   Past Medical History:  Diagnosis Date  . Anxiety     Past Surgical History:  Procedure Laterality Date  . NO PAST SURGERIES      No Known Allergies  Prior to Admission medications   Medication Sig Start Date End Date Taking? Authorizing Provider  calcium carbonate (TUMS) 500 MG chewable tablet Chew 400 mg by mouth every 4 (four) hours as needed for heartburn.   Yes [provider]  ferrous sulfate 325 (65 FE) MG tablet Take 1 tablet (325 mg total) by mouth 2 (two) times daily with a meal. 06/05/18  Yes Genia Del, CNM  Prenatal Vit-Fe Fumarate-FA (PRENATAL MULTIVITAMIN) TABS tablet Take 1 tablet by mouth daily at 12 noon.   Yes [provider]     Prenatal care site: [facility]   Social History: She  reports that she has never smoked. She has never used smokeless tobacco. She reports that she does not drink alcohol and does not use drugs.  Family History: family history is not on file.   Review of Systems: A full review of systems was performed and negative except as noted in the HPI.    Physical Exam:  Vital Signs: BP 111/63 (BP Location: Left Arm)   Pulse 85   Temp 98.1 F (36.7 C) (Oral)   Resp 16   Ht 5\' 7"  (1.702 m)   Wt 83.9 kg   LMP 09/20/2019   BMI 28.98 kg/m   General:   alert, cooperative, appears stated age and no distress  Skin:  normal and no rash or abnormalities  Neurologic:    Alert & oriented x 3  Lungs:   clear to auscultation  bilaterally  Heart:   regular rate and rhythm, S1, S2 normal, no murmur, click, rub or gallop  Abdomen:  soft, non-tender; bowel sounds normal; no masses,  no organomegaly  Pelvis:  Exam deferred.  FHT:  120 BPM  Presentations: cephalic  Extremities: : non-tender, symmetric, no edema bilaterally.     EFW: 7lb2oz  Results for orders placed or performed during the hospital encounter of 06/20/20 (from the past 24 hour(s))  CBC     Status: Abnormal   Collection Time: 06/20/20 12:56 AM  Result Value Ref Range   WBC 9.1 4.0 - 10.5 K/uL   RBC 3.65 (L) 3.87 - 5.11 MIL/uL   Hemoglobin 10.4 (L) 12.0 - 15.0 g/dL   HCT 06/22/20 (L) 36 - 46 %   MCV 84.9 80.0 - 100.0 fL   MCH 28.5 26.0 - 34.0 pg   MCHC 33.5 30.0 - 36.0 g/dL   RDW 29.5 62.1 - 30.8 %   Platelets 159 150 - 400 K/uL   nRBC 0.0 0.0 - 0.2 %  Type and screen     Status: None   Collection Time: 06/20/20 12:56 AM  Result Value Ref Range   ABO/RH(D) O POS    Antibody Screen NEG    Sample Expiration      06/23/2020,2359 Performed at [facility]  Lab, [address] [place], [place] [address]     Pertinent Results:  Prenatal Labs: Blood type/Rh O+  Antibody screen neg  Rubella Non-immune  Varicella Non-immune  RPR NR  HBsAg Neg  HIV NR  GC neg  Chlamydia neg  Genetic screening MaterniT21 negative  1 hour GTT 102  3 hour GTT n/a  GBS negative   FHT: FHR: 120 bpm, variability: moderate,  accelerations:  Present,  decelerations:  Absent Category/reactivity:  Category I TOCO: irregular, every 4-6 minutes  Assessment:  Gabriela Fuller is a 29 y.o. [phone] female at [redacted]w[redacted]d with elective induction of labor.   Plan:  1. Admit to Labor & Delivery; consents reviewed and obtained  2. Fetal Well being  - Fetal Tracing: category I - GBS negative - Presentation: cephalic confirmed by bedside ultrasound   3. Routine OB: - Prenatal labs reviewed, as above - Rh positive - CBC & T&S on admit - Saline lock, PO   hydration  4. Induction of Labor: -  Contractions to be monitored with external toco in place -  Pelvis proven to 3760g -  Plan for induction with misoprostol -  Plan for continuous fetal monitoring  -  Maternal pain control as desired: IVPM, regional anesthesia -  Anticipate vaginal delivery  5. Post Partum Planning: - Infant feeding: breast - Contraception: IUD vs Depo-Provera  Genia Del, CNM 06/20/2020 4:11 AM ----- Genia Del Certified Nurse Midwife [facility], Department of OB/GYN [facility]

## 2020-06-20 NOTE — Progress Notes (Signed)
VSS. External monitors removed and pt is showering. SVE performed by A.Tami Lin, CNM 3.5/60/-2. Pt can have a regular diet, finish showering and pitocin can be started afterwards. Provider reviewed strip.

## 2020-06-20 NOTE — Progress Notes (Signed)
Labor Progress Note  Gabriela Fuller is a 29 y.o. [id number] at [redacted]w[redacted]d by LMP admitted for induction of labor due to Elective at term.  Subjective: Just took a shower, contractions were starting to feel more frequent but spaced after showerr  Objective: BP 115/61 (BP Location: Left Arm)   Pulse 65   Temp 98.1 F (36.7 C) (Oral)   Resp 16   Ht 5\' 7"  (1.702 m)   Wt 83.9 kg   LMP 09/20/2019   BMI 28.98 kg/m  Notable VS details: reviewed   Fetal Assessment: - Currently off monitor for shower. Fetal tracing prior to shower showed: FHT:  FHR: 125 bpm, variability: moderate,  accelerations:  Present,  decelerations:  Absent Category/reactivity:  Category I UC:   regular, every 1-5 minutes, mild to palpation  SVE:   3-4/60/-2/mid Membrane status: Intact - bulging membranes with contractions  Amniotic color: N/A  Labs: Lab Results  Component Value Date   WBC 9.1 06/20/2020   HGB 10.4 (L) 06/20/2020   HCT 31.0 (L) 06/20/2020   MCV 84.9 06/20/2020   PLT 159 06/20/2020    Assessment / Plan: Latent labor, s/p misoprostol x 2.  Will start oxytocin   Labor: Plan to start pitocin  Preeclampsia:  no s/s Fetal Wellbeing:  Category I Pain Control:  Labor support without medications and planning epidural when contractions more uncomfortable I/D:  n/a  06/22/2020, CNM 06/20/2020, 12:15 PM

## 2020-06-20 NOTE — Discharge Summary (Signed)
Obstetrical Discharge Summary  Patient Name: Gabriela Fuller DOB: December 21, 1990 MRN: [id number]  Date of Admission: 06/20/2020 Date of Delivery: 06/20/2020 Delivered by: Margaretmary Eddy, CNM  Date of Discharge: 06/21/2020  Primary OB: [facility] ASN:[id number] last menstrual period was 09/20/2019. EDC Estimated Date of Delivery: 06/26/20 Gestational Age at Delivery: [redacted]w[redacted]d   Antepartum complications:  1. Varicella non-immune 2. Rubella non-immune 3. Iron deficiency anemia in pregnancy   Admitting Diagnosis: Elective IOL at term  Secondary Diagnosis: Patient Active Problem List   Diagnosis Date Noted  . NSVD (normal spontaneous vaginal delivery) 06/21/2020  . Acute blood loss anemia 06/21/2020  . Encounter for supervision of other normal pregnancy, third trimester 01/22/2020  . Family history of developmental disability 03/09/2016  . Anxiety and depression 12/09/2015    Augmentation: Pitocin and Cytotec Complications: None Intrapartum complications/course: Gabriela Fuller presented to L&D for elective IOL at term.  Labor was induced with misoprostol followed by oxytocin.  Her contractions became more painful and she SROM'd after her epidural placement.  Recurrent variable decelerations were noted during transition.  Intrauterine resuscitation measures were utilized to include position changes, amnioinfusion, and oxytocin was discontinued.  She progressed to C/C/+2 spontaneously with urge to push.  Variables improved, though some were still noted with pushing.  She pushed well over 17 minutes for spontaneous birth of baby boy "Waylin".  Delivery Type: spontaneous vaginal delivery Anesthesia: epidural Placenta: spontaneous Laceration: None Episiotomy: none Newborn Data: Live born female "Waylin" Birth Weight:  3550g 7lbs13.2oz APGAR: 8, 9  Newborn Delivery   Birth date/time: 06/20/2020 20:25:00 Delivery type: Vaginal, Spontaneous     Postpartum Procedures: none Edinburgh:   Edinburgh Postnatal Depression Scale Screening Tool 06/21/2020 06/21/2020  I have been able to laugh and see the funny side of things. 0 (No Data)  I have looked forward with enjoyment to things. 0 -  I have blamed myself unnecessarily when things went wrong. 1 -  I have been anxious or worried for no good reason. 0 -  I have felt scared or panicky for no good reason. 0 -  Things have been getting on top of me. 1 -  I have been so unhappy that I have had difficulty sleeping. 0 -  I have felt sad or miserable. 0 -  I have been so unhappy that I have been crying. 0 -  The thought of harming myself has occurred to me. 0 -  Edinburgh Postnatal Depression Scale Total 2 -     Post partum course:  Patient had an uncomplicated postpartum course.  By time of discharge on PPD#1, her pain was controlled on oral pain medications; she had appropriate lochia and was ambulating, voiding without difficulty and tolerating regular diet.  She was deemed stable for discharge to home.    Discharge Physical Exam:  BP 105/67 (BP Location: Left Arm)   Pulse 60   Temp 98.2 F (36.8 C) (Oral)   Resp 18   Ht 5\' 7"  (1.702 m)   Wt 83.9 kg   LMP 09/20/2019   SpO2 99% Comment: Room Air  Breastfeeding Unknown   BMI 28.98 kg/m   General: NAD CV: RRR Pulm: CTABL, nl effort ABD: s/nd/nt, fundus firm and below the umbilicus Lochia: moderate Perineum:minimal edema/repair well approximated DVT Evaluation: LE non-ttp, no evidence of DVT on exam.  Hemoglobin  Date Value Ref Range Status  06/21/2020 10.5 (L) 12.0 - 15.0 g/dL Final   HGB  Date Value Ref Range Status  09/25/2014 12.8 12.0 -  16.0 g/dL Final   HCT  Date Value Ref Range Status  06/21/2020 30.7 (L) 36 - 46 % Final  09/25/2014 38.8 35.0 - 47.0 % Final    Disposition: stable, discharge to home. Baby Feeding: breastmilk Baby Disposition: home with mom  Rh Immune globulin given: Rh pos Rubella vaccine given: Non-immune, offer vaccine  postpartum Varivax vaccine given: Non-immune, offer vaccine postpartum  Flu vaccine given in AP or PP setting: declined  Tdap vaccine given in AP or PP setting: given 04/09/2020  Contraception: Considering Depo, Nuvaring, or Nexplanon  Prenatal Labs:  Blood type/Rh O pos  Antibody screen neg  Rubella Non-Immune  Varicella Non-Immune  RPR NR  HBsAg Neg  HIV NR  GC neg  Chlamydia neg  Genetic screening negative  1 hour GTT 102  3 hour GTT N/A  GBS Neg   Plan:  Gabriela Fuller was discharged to home in good condition. Follow-up appointment with delivering provider in 6 weeks.  Discharge Medications: Allergies as of 06/21/2020   No Known Allergies     Medication List    TAKE these medications   ferrous sulfate 325 (65 FE) MG tablet Take 1 tablet (325 mg total) by mouth daily with breakfast. What changed: when to take this   oxyCODONE 5 MG immediate release tablet Commonly known as: Oxy IR/ROXICODONE Take 1 tablet (5 mg total) by mouth every 4 (four) hours as needed for moderate pain, severe pain or breakthrough pain.   prenatal multivitamin Tabs tablet Take 1 tablet by mouth daily at 12 noon.   Tums 500 MG chewable tablet Generic drug: calcium carbonate Chew 400 mg by mouth every 4 (four) hours as needed for heartburn.        Follow-up Information    Gustavo Lah, CNM. Schedule an appointment as soon as possible for a visit in 6 week(s).   Specialty: Certified Nurse Midwife Why: postpartum visit  Contact information: [address] [place] [address] [phone]               Signed: Cyril Mourning 06/21/2020 7:46 PM

## 2020-06-20 NOTE — Anesthesia Procedure Notes (Addendum)
Epidural Patient location during procedure: OB Start time: 06/20/2020 3:05 PM End time: 06/20/2020 3:19 PM  Staffing Anesthesiologist: Piscitello, Cleda Mccreedy, MD Resident/CRNA: Junious Silk, CRNA Performed: resident/CRNA   Preanesthetic Checklist Completed: patient identified, IV checked, site marked, risks and benefits discussed, surgical consent, monitors and equipment checked, pre-op evaluation and timeout performed  Epidural Patient position: sitting Prep: ChloraPrep Patient monitoring: heart rate, continuous pulse ox and blood pressure Approach: midline Location: L3-L4 Injection technique: LOR saline  Needle:  Needle type: Tuohy  Needle gauge: 17 G Needle length: 9 cm and 9 Catheter type: closed end flexible Catheter size: 19 Gauge Test dose: negative and 1.5% lidocaine with Epi 1:200 K  Assessment Events: blood not aspirated, injection not painful, no injection resistance, no paresthesia and negative IV test  Additional Notes 1 attempt Pt. Evaluated and documentation done after procedure finished. Patient identified. Risks/Benefits/Options discussed with patient including but not limited to bleeding, infection, nerve damage, paralysis, failed block, incomplete pain control, headache, blood pressure changes, nausea, vomiting, reactions to medication both or allergic, itching and postpartum back pain. Confirmed with bedside nurse the patient's most recent platelet count. Confirmed with patient that they are not currently taking any anticoagulation, have any bleeding history or any family history of bleeding disorders. Patient expressed understanding and wished to proceed. All questions were answered. Sterile technique was used throughout the entire procedure. Please see nursing notes for vital signs. Test dose was given through epidural catheter and negative prior to continuing to dose epidural or start infusion. Warning signs of high block given to the patient including shortness  of breath, tingling/numbness in hands, complete motor block, or any concerning symptoms with instructions to call for help. Patient was given instructions on fall risk and not to get out of bed. All questions and concerns addressed with instructions to call with any issues or inadequate analgesia.   Patient tolerated the insertion well without immediate complications.Reason for block:procedure for pain

## 2020-06-20 NOTE — Anesthesia Preprocedure Evaluation (Signed)
Anesthesia Evaluation  Patient identified by MRN, date of birth, ID band Patient awake    Reviewed: Allergy & Precautions, H&P , NPO status , Patient's Chart, lab work & pertinent test results  Airway Mallampati: II  TM Distance: >3 FB Neck ROM: full    Dental no notable dental hx. (+) Teeth Intact   Pulmonary    Pulmonary exam normal        Cardiovascular Normal cardiovascular exam     Neuro/Psych PSYCHIATRIC DISORDERS Anxiety Depression negative neurological ROS     GI/Hepatic negative GI ROS, Neg liver ROS,   Endo/Other  negative endocrine ROS  Renal/GU negative Renal ROS  negative genitourinary   Musculoskeletal   Abdominal   Peds  Hematology negative hematology ROS (+)   Anesthesia Other Findings   Reproductive/Obstetrics (+) Pregnancy                             Anesthesia Physical Anesthesia Plan  ASA: II  Anesthesia Plan: Epidural   Post-op Pain Management:    Induction:   PONV Risk Score and Plan:   Airway Management Planned:   Additional Equipment:   Intra-op Plan:   Post-operative Plan:   Informed Consent: I have reviewed the patients History and Physical, chart, labs and discussed the procedure including the risks, benefits and alternatives for the proposed anesthesia with the patient or authorized representative who has indicated his/her understanding and acceptance.     Dental Advisory Given  Plan Discussed with: Anesthesiologist and CRNA  Anesthesia Plan Comments:         Anesthesia Quick Evaluation

## 2020-06-21 DIAGNOSIS — D62 Acute posthemorrhagic anemia: Secondary | ICD-10-CM

## 2020-06-21 LAB — CBC
HCT: 30.7 % — ABNORMAL LOW (ref 36.0–46.0)
Hemoglobin: 10.5 g/dL — ABNORMAL LOW (ref 12.0–15.0)
MCH: 29.1 pg (ref 26.0–34.0)
MCHC: 34.2 g/dL (ref 30.0–36.0)
MCV: 85 fL (ref 80.0–100.0)
Platelets: 129 10*3/uL — ABNORMAL LOW (ref 150–400)
RBC: 3.61 MIL/uL — ABNORMAL LOW (ref 3.87–5.11)
RDW: 13.3 % (ref 11.5–15.5)
WBC: 11.7 10*3/uL — ABNORMAL HIGH (ref 4.0–10.5)
nRBC: 0 % (ref 0.0–0.2)

## 2020-06-21 MED ORDER — FERROUS SULFATE 325 (65 FE) MG PO TABS: 325.0000 mg | ORAL_TABLET | Freq: Every day | ORAL | 0 refills | Status: AC

## 2020-06-21 MED ORDER — OXYCODONE HCL 5 MG PO TABS
5.0000 mg | ORAL_TABLET | ORAL | Status: DC | PRN
  Administered 2020-06-21 (×4): 5 mg via ORAL
  Filled 2020-06-21 (×4): qty 1

## 2020-06-21 MED ORDER — OXYCODONE HCL 5 MG PO TABS: 5.0000 mg | ORAL_TABLET | ORAL | 0 refills | Status: DC | PRN

## 2020-06-21 NOTE — Lactation Note (Addendum)
This note was copied from a baby's chart. Lactation Consultation Note  Patient Name: Gabriela Fuller XNATF'[id number] Date: 06/21/2020 Reason for consult: Initial assessment;Mother's request;Term   Maternal Data Formula Feeding for Exclusion: No Does the patient have breastfeeding experience prior to this delivery?: Yes Hx of low supply and sore nipples Feeding Feeding Type: Breast Fed Baby latched easily to right breast, football hold, needs some stimulation to suck but hear swallows, sleepy, mom has some pumped colostrum at home that she pumped before delivery LATCH Score Latch: Grasps breast easily, tongue down, lips flanged, rhythmical sucking.  Audible Swallowing: A few with stimulation  Type of Nipple: Everted at rest and after stimulation  Comfort (Breast/Nipple): Filling, red/small blisters or bruises, mild/mod discomfort  Hold (Positioning): No assistance needed to correctly position infant at breast.  LATCH Score: 8  Interventions Interventions: Breast feeding basics reviewed;Skin to skin [place] handout and breastfeeding resources handout given, LC name and no written on white board   Lactation Tools Discussed/Used WIC Program: No   Consult Status Consult Status: PRN    Dyann Kief 06/21/2020, 2:42 PM

## 2020-06-21 NOTE — Progress Notes (Signed)
All discharge orders reviewed with patient; she verbalized of same; copy given.

## 2020-06-21 NOTE — Progress Notes (Signed)
Patient discharged to home. Patient left 3rd floor via wheelchair with infant in her arms accompanied by husband and Brion Aliment NT.

## 2020-06-21 NOTE — Anesthesia Postprocedure Evaluation (Signed)
Anesthesia Post Note  Patient: Gabriela Fuller  Procedure(s) Performed: AN AD HOC LABOR EPIDURAL  Patient location during evaluation: Mother Baby Anesthesia Type: Epidural Level of consciousness: awake and alert Pain management: pain level controlled Vital Signs Assessment: post-procedure vital signs reviewed and stable Respiratory status: spontaneous breathing, nonlabored ventilation and respiratory function stable Cardiovascular status: stable Postop Assessment: no headache and epidural receding Anesthetic complications: no Comments: Pt reports back is core and improving. Instructed pt to let anesthesia know if pain increases.    No complications documented.   Last Vitals:  Vitals:   06/21/20 0050 06/21/20 0407  BP: (!) 97/53 104/90  Pulse: 70 71  Resp: 20 20  Temp: 36.7 C 36.6 C  SpO2: 99% 100%    Last Pain:  Vitals:   06/21/20 0411  TempSrc:   PainSc: 6                  Rosanne Gutting

## 2020-06-21 NOTE — Discharge Instructions (Signed)
[facility]    Vaginal Delivery, Care After Refer to this sheet in the next few weeks. These discharge instructions provide you with information on caring for yourself after delivery. Your caregiver may also give you specific instructions. Your treatment has been planned according to the most current medical practices available, but problems sometimes occur. Call your caregiver if you have any problems or questions after you go home. HOME CARE INSTRUCTIONS 1. Take over-the-counter or prescription medicines only as directed by your caregiver or pharmacist. 2. Do not drink alcohol, especially if you are breastfeeding or taking medicine to relieve pain. 3. Do not smoke tobacco. 4. Continue to use good perineal care. Good perineal care includes: 1. Wiping your perineum from back to front 2. Keeping your perineum clean. 3. You can do sitz baths twice a day, to help keep this area clean 5. Do not use tampons, douche or have sex until your caregiver says it is okay. 6. Shower only and avoid sitting in submerged water, aside from sitz baths 7. Wear a well-fitting bra that provides breast support. 8. Eat healthy foods. 9. Drink enough fluids to keep your urine clear or pale yellow. 10. Eat high-fiber foods such as whole grain cereals and breads, brown rice, beans, and fresh fruits and vegetables every day. These foods may help prevent or relieve constipation. 11. Avoid constipation with high fiber foods or medications, such as miralax or metamucil 12. Follow your caregiver's recommendations regarding resumption of activities such as climbing stairs, driving, lifting, exercising, or traveling. 13. Talk to your caregiver about resuming sexual activities. Resumption of sexual activities is dependent upon your risk of infection, your rate of healing, and your comfort and desire to resume sexual activity. 14. Try to have someone help you with your household activities and your newborn for at least a few  days after you leave the hospital. 15. Rest as much as possible. Try to rest or take a nap when your newborn is sleeping. 16. Increase your activities gradually. 17. Keep all of your scheduled postpartum appointments. It is very important to keep your scheduled follow-up appointments. At these appointments, your caregiver will be checking to make sure that you are healing physically and emotionally. SEEK MEDICAL CARE IF:   You are passing large clots from your vagina. Save any clots to show your caregiver.  You have a foul smelling discharge from your vagina.  You have trouble urinating.  You are urinating frequently.  You have pain when you urinate.  You have a change in your bowel movements.  You have increasing redness, pain, or swelling near your vaginal incision (episiotomy) or vaginal tear.  You have pus draining from your episiotomy or vaginal tear.  Your episiotomy or vaginal tear is separating.  You have painful, hard, or reddened breasts.  You have a severe headache.  You have blurred vision or see spots.  You feel sad or depressed.  You have thoughts of hurting yourself or your newborn.  You have questions about your care, the care of your newborn, or medicines.  You are dizzy or light-headed.  You have a rash.  You have nausea or vomiting.  You were breastfeeding and have not had a menstrual period within 12 weeks after you stopped breastfeeding.  You are not breastfeeding and have not had a menstrual period by the 12th week after delivery.  You have a fever. SEEK IMMEDIATE MEDICAL CARE IF:   You have persistent pain.  You have chest pain.  You have  shortness of breath.  You faint.  You have leg pain.  You have stomach pain.  Your vaginal bleeding saturates two or more sanitary pads in 1 hour. MAKE SURE YOU:   Understand these instructions.  Will watch your condition.  Will get help right away if you are not doing well or get  worse. Document Released: 08/14/2000 Document Revised: 01/01/2014 Document Reviewed: 04/13/2012 [facility] Patient Information 2015 [place], [place]. This information is not intended to replace advice given to you by your health care provider. Make sure you discuss any questions you have with your health care provider.  Sitz Bath A sitz bath is a warm water bath taken in the sitting position. The water covers only the hips and butt (buttocks). We recommend using one that fits in the toilet, to help with ease of use and cleanliness. It may be used for either healing or cleaning purposes. Sitz baths are also used to relieve pain, itching, or muscle tightening (spasms). The water may contain medicine. Moist heat will help you heal and relax.  HOME CARE  Take 3 to 4 sitz baths a day. 18. Fill the bathtub half-full with warm water. 19. Sit in the water and open the drain a little. 20. Turn on the warm water to keep the tub half-full. Keep the water running constantly. 21. Soak in the water for 15 to 20 minutes. 22. After the sitz bath, pat the affected area dry. GET HELP RIGHT AWAY IF: You get worse instead of better. Stop the sitz baths if you get worse. MAKE SURE YOU:  Understand these instructions.  Will watch your condition.  Will get help right away if you are not doing well or get worse. Document Released: 09/24/2004 Document Revised: 05/11/2012 Document Reviewed: 12/15/2010 [facility] Patient Information 2015 [place], [place]. This information is not intended to replace advice given to you by your health care provider. Make sure you discuss any questions you have with your health care provider.

## 2021-01-16 ENCOUNTER — Other Ambulatory Visit: Payer: Self-pay | Admitting: Family Medicine

## 2021-01-16 ENCOUNTER — Ambulatory Visit
Admission: RE | Admit: 2021-01-16 | Discharge: 2021-01-16 | Disposition: A | Discharge status: Home / Self Care | Payer: 59 | Source: Ambulatory Visit | Attending: Family Medicine | Admitting: Family Medicine

## 2021-01-16 ENCOUNTER — Other Ambulatory Visit: Payer: Self-pay

## 2021-01-16 DIAGNOSIS — R110 Nausea: Secondary | ICD-10-CM | POA: Insufficient documentation

## 2021-01-16 DIAGNOSIS — R1031 Right lower quadrant pain: Secondary | ICD-10-CM

## 2021-02-17 ENCOUNTER — Ambulatory Visit: Payer: 59 | Admitting: Gastroenterology

## 2021-02-17 ENCOUNTER — Encounter: Payer: Self-pay | Admitting: Gastroenterology

## 2021-05-07 DIAGNOSIS — Z3490 Encounter for supervision of normal pregnancy, unspecified, unspecified trimester: Secondary | ICD-10-CM | POA: Insufficient documentation

## 2021-05-13 LAB — OB RESULTS CONSOLE VARICELLA ZOSTER ANTIBODY, IGG: Varicella: NON-IMMUNE/NOT IMMUNE

## 2021-05-13 LAB — OB RESULTS CONSOLE HIV ANTIBODY (ROUTINE TESTING): HIV: NONREACTIVE

## 2021-05-13 LAB — OB RESULTS CONSOLE RUBELLA ANTIBODY, IGM: Rubella: NON-IMMUNE/NOT IMMUNE

## 2021-08-31 NOTE — L&D Delivery Note (Signed)
Delivery Note ? ?Gabriela Fuller is a [id number] at [redacted]w[redacted]d with an unknown LMP, not consistent with [place] at [redacted]w[redacted]d.  ? ?First Stage: ?Labor onset: 1530 ?Induction: misoprostol and oxytocin ?Analgesia /Anesthesia intrapartum: Epidural ?SROM at 1530 ?GBS: negative ?IP Antibiotics: N/A ? ?Second Stage: ?Complete dilation at 1720 ?Onset of pushing at 1725 ?FHR second stage 130bpm with moderate variability and intermittent earlies and variables with contractions  ? ?Gabriela Fuller presented to L&D for scheduled elective IOL at term.  She progressed well after 1 dose of misoprostol and low dose oxytocin.  Membranes spontaneously ruptured and she progressed quickly to C/C/+3 with an urge to push.  Mallika pushed quickly over approximately 11 minutes for a spontaneous vaginal birth in hands and knees position.  ?Delivery of a viable baby girl on 10/31/2021 at 87 by CNM ?Delivery of fetal head in OA position with restitution to LOT. ?No nuchal cord;  Anterior then posterior shoulders delivered easily with gentle downward traction. Infant passed to mom and encouraged to help hold her baby and baby attended to by baby RN.  Mom assisted to semi fowlers position and skin to skin continued. Mom easily latched baby for early breastfeeding while waiting for placenta to deliver.  Cord double clamped after cessation of pulsation and delivery of placenta, cut by grandmother ? ?Cord blood sample collection: Yes O POS ? ?Third Stage: ?Oxytocin bolus started after delivery of infant for hemorrhage prophylaxis  ?Placenta delivered intact with 3 VC @ 1751 ?Placenta disposition: discarded  ?Uterine tone firm / bleeding moderate ? ?No laceration identified  ?Anesthesia for repair: N/A ?Repair not indicated  ?Est. Blood Loss (mL): 241ml ? ?Complications: None ? ?Mom to postpartum.  Baby to Couplet care / Skin to Skin. ? ?Newborn: ?Information for the patient's newborn:  Loni, Kolin [id number]  ?Live born female "Gabriela Fuller" ?Birth Weight:   pending  ?APGAR: 9, 9 ? ?Newborn Delivery   ?Birth date/time: 10/31/2021 17:36:00 ?Delivery type: Vaginal, Spontaneous ?  ?  ?Feeding planned: breast  ? ?---------- ?Drinda Butts, CNM ?Certified Nurse Midwife ?[facility] ?[facility]   ?

## 2021-09-03 DIAGNOSIS — D509 Iron deficiency anemia, unspecified: Secondary | ICD-10-CM | POA: Insufficient documentation

## 2021-09-03 DIAGNOSIS — Z3483 Encounter for supervision of other normal pregnancy, third trimester: Secondary | ICD-10-CM | POA: Diagnosis not present

## 2021-10-08 DIAGNOSIS — Z3483 Encounter for supervision of other normal pregnancy, third trimester: Secondary | ICD-10-CM | POA: Diagnosis not present

## 2021-10-08 DIAGNOSIS — D509 Iron deficiency anemia, unspecified: Secondary | ICD-10-CM | POA: Diagnosis not present

## 2021-10-08 DIAGNOSIS — Z114 Encounter for screening for human immunodeficiency virus [HIV]: Secondary | ICD-10-CM | POA: Diagnosis not present

## 2021-10-08 DIAGNOSIS — O99013 Anemia complicating pregnancy, third trimester: Secondary | ICD-10-CM | POA: Diagnosis not present

## 2021-10-08 DIAGNOSIS — Z113 Encounter for screening for infections with a predominantly sexual mode of transmission: Secondary | ICD-10-CM | POA: Diagnosis not present

## 2021-10-09 LAB — OB RESULTS CONSOLE GBS: GBS: NEGATIVE

## 2021-10-09 LAB — OB RESULTS CONSOLE GC/CHLAMYDIA
Chlamydia: NEGATIVE
Gonorrhea: NEGATIVE

## 2021-10-09 LAB — OB RESULTS CONSOLE RPR: RPR: NONREACTIVE

## 2021-10-11 ENCOUNTER — Observation Stay
Admission: EM | Admit: 2021-10-11 | Discharge: 2021-10-11 | Disposition: A | Discharge status: Home / Self Care | Payer: BLUE CROSS/BLUE SHIELD | Attending: Obstetrics and Gynecology | Admitting: Obstetrics and Gynecology

## 2021-10-11 ENCOUNTER — Other Ambulatory Visit: Payer: Self-pay

## 2021-10-11 ENCOUNTER — Encounter: Payer: Self-pay | Admitting: Obstetrics and Gynecology

## 2021-10-11 DIAGNOSIS — B069 Rubella without complication: Secondary | ICD-10-CM | POA: Diagnosis not present

## 2021-10-11 DIAGNOSIS — O42913 Preterm premature rupture of membranes, unspecified as to length of time between rupture and onset of labor, third trimester: Principal | ICD-10-CM | POA: Insufficient documentation

## 2021-10-11 DIAGNOSIS — Z3A36 36 weeks gestation of pregnancy: Secondary | ICD-10-CM | POA: Insufficient documentation

## 2021-10-11 DIAGNOSIS — F419 Anxiety disorder, unspecified: Secondary | ICD-10-CM | POA: Diagnosis not present

## 2021-10-11 DIAGNOSIS — O99343 Other mental disorders complicating pregnancy, third trimester: Secondary | ICD-10-CM | POA: Diagnosis not present

## 2021-10-11 DIAGNOSIS — N898 Other specified noninflammatory disorders of vagina: Secondary | ICD-10-CM | POA: Diagnosis present

## 2021-10-11 DIAGNOSIS — O26893 Other specified pregnancy related conditions, third trimester: Secondary | ICD-10-CM | POA: Diagnosis present

## 2021-10-11 DIAGNOSIS — B019 Varicella without complication: Secondary | ICD-10-CM | POA: Diagnosis not present

## 2021-10-11 DIAGNOSIS — O98513 Other viral diseases complicating pregnancy, third trimester: Secondary | ICD-10-CM | POA: Diagnosis not present

## 2021-10-11 DIAGNOSIS — Z0371 Encounter for suspected problem with amniotic cavity and membrane ruled out: Secondary | ICD-10-CM | POA: Diagnosis not present

## 2021-10-11 DIAGNOSIS — O99013 Anemia complicating pregnancy, third trimester: Secondary | ICD-10-CM | POA: Insufficient documentation

## 2021-10-11 LAB — WET PREP, GENITAL
Clue Cells Wet Prep HPF POC: NONE SEEN
Sperm: NONE SEEN
Trich, Wet Prep: NONE SEEN
WBC, Wet Prep HPF POC: <10 (ref <10)
Yeast Wet Prep HPF POC: NONE SEEN

## 2021-10-11 LAB — RUPTURE OF MEMBRANE (ROM)PLUS: Rom Plus: NEGATIVE

## 2021-10-11 NOTE — Discharge Summary (Signed)
Tiegan Jambor is a 31 y.o. female. She is [redacted]w[redacted]d  No LMP recorded. Patient is pregnant. Estimated Date of Delivery: 11/04/21  Prenatal care site: [facility]   Current pregnancy complicated by:  1. Anemia 2. Varicella and Rubella NON-immune: offer PP vaccination 3. Anxiety   Chief complaint: leaking small fluids since yesterday, no large gushes, no VB, no UCs. No vaginal odor noted.    S: Resting comfortably. no CTX, no VB.no LOF,  Active fetal movement. Denies: HA, visual changes, SOB, or RUQ/epigastric pain  Maternal Medical History:   Past Medical History:  Diagnosis Date   Anxiety     Past Surgical History:  Procedure Laterality Date   NO PAST SURGERIES      No Known Allergies  Prior to Admission medications   Medication Sig Start Date End Date Taking? Authorizing Provider  calcium carbonate (TUMS) 500 MG chewable tablet Chew 400 mg by mouth every 4 (four) hours as needed for heartburn.    [provider]  ferrous sulfate 325 (65 FE) MG tablet Take 1 tablet (325 mg total) by mouth daily with breakfast. 06/21/20   Haroldine Laws, CNM  oxyCODONE (OXY IR/ROXICODONE) 5 MG immediate release tablet Take 1 tablet (5 mg total) by mouth every 4 (four) hours as needed for moderate pain, severe pain or breakthrough pain. 06/21/20   Haroldine Laws, CNM  Prenatal Vit-Fe Fumarate-FA (PRENATAL MULTIVITAMIN) TABS tablet Take 1 tablet by mouth daily at 12 noon.    [provider]      Social History: She  reports that she has never smoked. She has never used smokeless tobacco. She reports that she does not drink alcohol and does not use drugs.  Family History: family history is not on file.  Review of Systems: A full review of systems was performed and negative except as noted in the HPI.     O:  BP (!) 100/59 (BP Location: Left Arm)    Pulse 100    Temp 98.3 F (36.8 C) (Oral)    Resp 16    Ht 5\' 7"  (1.702 m)    Wt 83.9 kg    BMI 28.98 kg/m  Results  for orders placed or performed during the hospital encounter of 10/11/21 (from the past 48 hour(s))  Wet prep, genital   Collection Time: 10/11/21  8:45 AM   Specimen: Vaginal; Genital  Result Value Ref Range   Yeast Wet Prep HPF POC NONE SEEN NONE SEEN   Trich, Wet Prep NONE SEEN NONE SEEN   Clue Cells Wet Prep HPF POC NONE SEEN NONE SEEN   WBC, Wet Prep HPF POC <10 <10   Sperm NONE SEEN   ROM Plus ([facility] only)   Collection Time: 10/11/21  8:45 AM  Result Value Ref Range   Rom Plus NEGATIVE      Constitutional: NAD, AAOx3  HE/ENT: extraocular movements grossly intact, moist mucous membranes CV: RRR PULM: nl respiratory effort, CTABL     Abd: gravid, non-tender, non-distended, soft      Ext: Non-tender, Nonedematous   Psych: mood appropriate, speech normal Pelvic: SSE done- large amount creamy white DC noted on spec exam, no odor. Neg Pooling with valsalva.  SVE: 1/thick/soft, posterior and to pt left. Fetal head well applied with intact membranes palpated.   Fetal  monitoring: Cat I Appropriate for GA Baseline: 130 Variability: moderate Accelerations: present x >2 Decelerations absent Time 12/09/21  TOCO: occasional UC   A/P: 31 y.o. 37 at  [redacted]w[redacted]d  here for antenatal surveillance for suspected ROM not found  Principle Diagnosis:  suspected ROM not found; vaginal discharge  Labor: not present.  Fetal Wellbeing: Reassuring Cat 1 tracing. Reactive NST  D/c home stable, precautions reviewed, follow-up as scheduled.    Randa Ngo, CNM 10/11/2021  9:37 AM

## 2021-10-11 NOTE — OB Triage Note (Signed)
Pt discharged home per order.  Pt stable and ambulatory and an After Visit Summary was printed and given to the patient. Discharge education completed with patient/family including follow up instructions, appointments, and medication list. Pt received labor precautions. Patient able to verbalize understanding, all questions fully answered upon discharge. Pt discharged home via personal vehicle with support person.

## 2021-10-11 NOTE — OB Triage Note (Signed)
Pt presented to L/D triage with reported leaking of fluid since last night. She describes the fluid as clear, leaking intermittently. No large gushes noted. Pt reports no bleeding and positive fetal movement. No recent intercourse, no urinary symptoms at this time. Pt reports feeling intermittent mild contractions- no pain at this time.  Monitors applied and assessing- initial FHT 135.  VSS.  ROM+ and wet prep sent for eval.

## 2021-10-25 ENCOUNTER — Other Ambulatory Visit: Payer: Self-pay

## 2021-10-25 DIAGNOSIS — O99013 Anemia complicating pregnancy, third trimester: Secondary | ICD-10-CM

## 2021-10-25 DIAGNOSIS — O09893 Supervision of other high risk pregnancies, third trimester: Secondary | ICD-10-CM | POA: Insufficient documentation

## 2021-10-25 DIAGNOSIS — Z2839 Other underimmunization status: Secondary | ICD-10-CM | POA: Insufficient documentation

## 2021-10-25 DIAGNOSIS — F419 Anxiety disorder, unspecified: Secondary | ICD-10-CM

## 2021-10-25 DIAGNOSIS — Z3490 Encounter for supervision of normal pregnancy, unspecified, unspecified trimester: Secondary | ICD-10-CM

## 2021-10-25 DIAGNOSIS — O09899 Supervision of other high risk pregnancies, unspecified trimester: Secondary | ICD-10-CM

## 2021-10-25 DIAGNOSIS — D509 Iron deficiency anemia, unspecified: Secondary | ICD-10-CM

## 2021-10-25 DIAGNOSIS — F32A Depression, unspecified: Secondary | ICD-10-CM

## 2021-10-25 NOTE — Progress Notes (Signed)
[id number] at [redacted]w[redacted]d, unknown LMP, not c/w early [place] at [redacted]w[redacted]d.  Scheduled for induction of labor for elective at term on 10/31/2021 at 0800.   Prenatal provider: [facility] Pregnancy complicated by: 1. Encounter for elective induction of labor   2. Rubella non-immune status, antepartum   3. Susceptible to Varicella (non-immune), currently pregnant in third trimester   4. Anxiety and depression   5. Maternal iron deficiency anemia complicating pregnancy in third trimester      Prenatal Labs: Blood type/Rh O pos  Antibody screen neg  Rubella Non-Immune - Vaccine x 2  Varicella Non-Immune - Vaccine x 2  RPR NR  HBsAg Neg  HIV NR  GC neg  Chlamydia neg  Genetic screening cfDNA negative  1 hour GTT 101  3 hour GTT N/A  GBS Neg   Tdap: declined  Flu: declined  Contraception: considering ParaGard IUD Feeding preference: Breast   ____ Margaretmary Eddy, CNM Certified Nurse Midwife [facility]

## 2021-10-31 ENCOUNTER — Inpatient Hospital Stay: Payer: BLUE CROSS/BLUE SHIELD | Admitting: Anesthesiology

## 2021-10-31 ENCOUNTER — Encounter: Payer: Self-pay | Admitting: Obstetrics and Gynecology

## 2021-10-31 ENCOUNTER — Inpatient Hospital Stay
Admission: RE | Admit: 2021-10-31 | Discharge: 2021-11-01 | DRG: 807 | Disposition: A | Discharge status: Home / Self Care | Payer: BLUE CROSS/BLUE SHIELD

## 2021-10-31 ENCOUNTER — Other Ambulatory Visit: Payer: Self-pay

## 2021-10-31 DIAGNOSIS — O09899 Supervision of other high risk pregnancies, unspecified trimester: Secondary | ICD-10-CM

## 2021-10-31 DIAGNOSIS — F419 Anxiety disorder, unspecified: Secondary | ICD-10-CM | POA: Diagnosis present

## 2021-10-31 DIAGNOSIS — Z2839 Other underimmunization status: Principal | ICD-10-CM

## 2021-10-31 DIAGNOSIS — Z3A39 39 weeks gestation of pregnancy: Secondary | ICD-10-CM | POA: Diagnosis not present

## 2021-10-31 DIAGNOSIS — Z3490 Encounter for supervision of normal pregnancy, unspecified, unspecified trimester: Secondary | ICD-10-CM | POA: Diagnosis present

## 2021-10-31 DIAGNOSIS — O99344 Other mental disorders complicating childbirth: Secondary | ICD-10-CM | POA: Diagnosis present

## 2021-10-31 DIAGNOSIS — D509 Iron deficiency anemia, unspecified: Secondary | ICD-10-CM | POA: Diagnosis present

## 2021-10-31 DIAGNOSIS — Z20822 Contact with and (suspected) exposure to covid-19: Secondary | ICD-10-CM | POA: Diagnosis present

## 2021-10-31 DIAGNOSIS — F32A Depression, unspecified: Secondary | ICD-10-CM | POA: Diagnosis present

## 2021-10-31 DIAGNOSIS — O09893 Supervision of other high risk pregnancies, third trimester: Secondary | ICD-10-CM

## 2021-10-31 DIAGNOSIS — O9902 Anemia complicating childbirth: Principal | ICD-10-CM | POA: Diagnosis present

## 2021-10-31 DIAGNOSIS — O26893 Other specified pregnancy related conditions, third trimester: Secondary | ICD-10-CM | POA: Diagnosis present

## 2021-10-31 LAB — CBC
HCT: 35.3 % — ABNORMAL LOW (ref 36.0–46.0)
Hemoglobin: 11.5 g/dL — ABNORMAL LOW (ref 12.0–15.0)
MCH: 26.8 pg (ref 26.0–34.0)
MCHC: 32.6 g/dL (ref 30.0–36.0)
MCV: 82.3 fL (ref 80.0–100.0)
Platelets: 167 10*3/uL (ref 150–400)
RBC: 4.29 MIL/uL (ref 3.87–5.11)
RDW: 13.1 % (ref 11.5–15.5)
WBC: 8 10*3/uL (ref 4.0–10.5)
nRBC: 0 % (ref 0.0–0.2)

## 2021-10-31 LAB — TYPE AND SCREEN
ABO/RH(D): O POS
Antibody Screen: NEGATIVE

## 2021-10-31 LAB — RESP PANEL BY RT-PCR (FLU A&B, COVID) ARPGX2
Influenza A by PCR: NEGATIVE
Influenza B by PCR: NEGATIVE
SARS Coronavirus 2 by RT PCR: NEGATIVE

## 2021-10-31 MED ORDER — MISOPROSTOL 25 MCG QUARTER TABLET
25.0000 ug | ORAL_TABLET | ORAL | Status: DC | PRN
  Administered 2021-10-31: 25 ug via VAGINAL
  Filled 2021-10-31: qty 1

## 2021-10-31 MED ORDER — SIMETHICONE 80 MG PO CHEW
80.0000 mg | CHEWABLE_TABLET | ORAL | Status: DC | PRN
  Administered 2021-10-31 – 2021-11-01 (×2): 80 mg via ORAL
  Filled 2021-10-31 (×3): qty 1

## 2021-10-31 MED ORDER — SERTRALINE HCL 25 MG PO TABS
25.0000 mg | ORAL_TABLET | Freq: Every day | ORAL | Status: DC
  Administered 2021-11-01: 25 mg via ORAL
  Filled 2021-10-31 (×2): qty 1

## 2021-10-31 MED ORDER — FENTANYL CITRATE (PF) 100 MCG/2ML IJ SOLN
50.0000 ug | INTRAMUSCULAR | Status: DC | PRN
  Filled 2021-10-31: qty 2

## 2021-10-31 MED ORDER — SODIUM CHLORIDE 0.9 % IV SOLN: 250.0000 mL | INTRAVENOUS | Status: DC | PRN

## 2021-10-31 MED ORDER — FENTANYL-BUPIVACAINE-NACL 0.5-0.125-0.9 MG/250ML-% EP SOLN
EPIDURAL | Status: AC
  Filled 2021-10-31: qty 250

## 2021-10-31 MED ORDER — OXYTOCIN-SODIUM CHLORIDE 30-0.9 UT/500ML-% IV SOLN
2.5000 [IU]/h | INTRAVENOUS | Status: DC
  Filled 2021-10-31: qty 500

## 2021-10-31 MED ORDER — PRENATAL MULTIVITAMIN CH: 1.0000 | ORAL_TABLET | Freq: Every day | ORAL | Status: DC

## 2021-10-31 MED ORDER — PRENATAL MULTIVITAMIN CH
1.0000 | ORAL_TABLET | Freq: Every day | ORAL | Status: DC
  Administered 2021-11-01: 1 via ORAL
  Filled 2021-10-31: qty 1

## 2021-10-31 MED ORDER — ONDANSETRON HCL 4 MG/2ML IJ SOLN: 4.0000 mg | INTRAMUSCULAR | Status: DC | PRN

## 2021-10-31 MED ORDER — LIDOCAINE-EPINEPHRINE (PF) 1.5 %-1:200000 IJ SOLN
INTRAMUSCULAR | Status: AC | PRN
  Administered 2021-10-31: 3 mL via PERINEURAL

## 2021-10-31 MED ORDER — ACETAMINOPHEN 500 MG PO TABS
1000.0000 mg | ORAL_TABLET | Freq: Four times a day (QID) | ORAL | Status: DC | PRN
Start: 2021-10-31 — End: 2021-11-01
  Administered 2021-10-31 – 2021-11-01 (×4): 1000 mg via ORAL
  Filled 2021-10-31 (×4): qty 2

## 2021-10-31 MED ORDER — AMMONIA AROMATIC IN INHA
RESPIRATORY_TRACT | Status: AC
  Filled 2021-10-31: qty 10

## 2021-10-31 MED ORDER — LIDOCAINE HCL (PF) 1 % IJ SOLN
30.0000 mL | INTRAMUSCULAR | Status: DC | PRN
  Filled 2021-10-31: qty 30

## 2021-10-31 MED ORDER — SODIUM CHLORIDE 0.9% FLUSH: 3.0000 mL | INTRAVENOUS | Status: DC | PRN

## 2021-10-31 MED ORDER — ONDANSETRON HCL 4 MG/2ML IJ SOLN
4.0000 mg | Freq: Four times a day (QID) | INTRAMUSCULAR | Status: DC | PRN
  Administered 2021-10-31: 4 mg via INTRAVENOUS
  Filled 2021-10-31: qty 2

## 2021-10-31 MED ORDER — SODIUM CHLORIDE 0.9% FLUSH: 3.0000 mL | Freq: Two times a day (BID) | INTRAVENOUS | Status: DC

## 2021-10-31 MED ORDER — OXYTOCIN BOLUS FROM INFUSION
333.0000 mL | Freq: Once | INTRAVENOUS | Status: AC
  Administered 2021-10-31: 333 mL via INTRAVENOUS

## 2021-10-31 MED ORDER — ONDANSETRON HCL 4 MG PO TABS: 4.0000 mg | ORAL_TABLET | ORAL | Status: DC | PRN

## 2021-10-31 MED ORDER — WITCH HAZEL-GLYCERIN EX PADS
1.0000 "application " | MEDICATED_PAD | CUTANEOUS | Status: DC
  Filled 2021-10-31: qty 100

## 2021-10-31 MED ORDER — LIDOCAINE HCL (PF) 1 % IJ SOLN
INTRAMUSCULAR | Status: AC | PRN
  Administered 2021-10-31: 2 mL

## 2021-10-31 MED ORDER — MISOPROSTOL 200 MCG PO TABS
ORAL_TABLET | ORAL | Status: AC
  Filled 2021-10-31: qty 4

## 2021-10-31 MED ORDER — LACTATED RINGERS IV SOLN: 500.0000 mL | INTRAVENOUS | Status: DC | PRN

## 2021-10-31 MED ORDER — DIPHENHYDRAMINE HCL 25 MG PO CAPS
25.0000 mg | ORAL_CAPSULE | Freq: Four times a day (QID) | ORAL | Status: DC | PRN
Start: 2021-10-31 — End: 2021-11-01

## 2021-10-31 MED ORDER — BENZOCAINE-MENTHOL 20-0.5 % EX AERO
1.0000 "application " | INHALATION_SPRAY | CUTANEOUS | Status: DC | PRN
  Administered 2021-10-31: 1 via TOPICAL
  Filled 2021-10-31 (×3): qty 56

## 2021-10-31 MED ORDER — OXYTOCIN 10 UNIT/ML IJ SOLN
INTRAMUSCULAR | Status: AC
  Filled 2021-10-31: qty 2

## 2021-10-31 MED ORDER — FENTANYL-BUPIVACAINE-NACL 0.5-0.125-0.9 MG/250ML-% EP SOLN
EPIDURAL | Status: AC | PRN
  Administered 2021-10-31: 12 mL/h via EPIDURAL

## 2021-10-31 MED ORDER — LACTATED RINGERS IV SOLN: INTRAVENOUS | Status: DC

## 2021-10-31 MED ORDER — SOD CITRATE-CITRIC ACID 500-334 MG/5ML PO SOLN: 30.0000 mL | ORAL | Status: DC | PRN

## 2021-10-31 MED ORDER — CALCIUM CARBONATE ANTACID 500 MG PO CHEW
400.0000 mg | CHEWABLE_TABLET | Freq: Three times a day (TID) | ORAL | Status: DC | PRN
Start: 2021-10-31 — End: 2021-10-31

## 2021-10-31 MED ORDER — ACETAMINOPHEN 500 MG PO TABS: 1000.0000 mg | ORAL_TABLET | Freq: Four times a day (QID) | ORAL | Status: DC | PRN

## 2021-10-31 MED ORDER — TERBUTALINE SULFATE 1 MG/ML IJ SOLN: 0.2500 mg | Freq: Once | INTRAMUSCULAR | Status: DC | PRN

## 2021-10-31 MED ORDER — FERROUS SULFATE 325 (65 FE) MG PO TABS
325.0000 mg | ORAL_TABLET | Freq: Two times a day (BID) | ORAL | Status: DC
  Administered 2021-11-01 (×2): 325 mg via ORAL
  Filled 2021-10-31 (×2): qty 1

## 2021-10-31 MED ORDER — OXYTOCIN-SODIUM CHLORIDE 30-0.9 UT/500ML-% IV SOLN
1.0000 m[IU]/min | INTRAVENOUS | Status: DC
  Administered 2021-10-31: 2 m[IU]/min via INTRAVENOUS
  Filled 2021-10-31: qty 500

## 2021-10-31 MED ORDER — DIBUCAINE (PERIANAL) 1 % EX OINT
1.0000 "application " | TOPICAL_OINTMENT | CUTANEOUS | Status: DC | PRN
  Administered 2021-10-31: 1 via RECTAL
  Filled 2021-10-31 (×2): qty 28

## 2021-10-31 MED ORDER — BUPIVACAINE HCL (PF) 0.25 % IJ SOLN
INTRAMUSCULAR | Status: AC | PRN
  Administered 2021-10-31: 10 mL via EPIDURAL

## 2021-10-31 MED ORDER — COCONUT OIL OIL
1.0000 "application " | TOPICAL_OIL | Status: DC | PRN
  Administered 2021-11-01: 1 via TOPICAL
  Filled 2021-10-31 (×2): qty 120

## 2021-10-31 MED ORDER — IBUPROFEN 600 MG PO TABS
600.0000 mg | ORAL_TABLET | Freq: Four times a day (QID) | ORAL | Status: DC
  Administered 2021-10-31 – 2021-11-01 (×4): 600 mg via ORAL
  Filled 2021-10-31 (×3): qty 1

## 2021-10-31 MED ORDER — DOCUSATE SODIUM 100 MG PO CAPS
100.0000 mg | ORAL_CAPSULE | Freq: Two times a day (BID) | ORAL | Status: DC
  Administered 2021-11-01: 100 mg via ORAL
  Filled 2021-10-31: qty 1

## 2021-10-31 MED ORDER — MISOPROSTOL 25 MCG QUARTER TABLET
25.0000 ug | ORAL_TABLET | ORAL | Status: DC | PRN
Start: 2021-10-31 — End: 2021-10-31
  Administered 2021-10-31: 25 ug via BUCCAL
  Filled 2021-10-31: qty 1

## 2021-10-31 NOTE — Anesthesia Preprocedure Evaluation (Signed)
Anesthesia Evaluation  ?Patient identified by MRN, date of birth, ID band ?Patient awake ? ? ? ?Reviewed: ?Allergy & Precautions, NPO status , Patient's Chart, lab work & pertinent test results, reviewed documented beta blocker date and time  ? ?History of Anesthesia Complications ?Negative for: history of anesthetic complications ? ?Airway ?Mallampati: II ? ?TM Distance: >3 FB ? ? ? ? Dental ?no notable dental hx. ? ?  ?Pulmonary ?neg pulmonary ROS,  ?  ?Pulmonary exam normal ? ? ? ? ? ? ? Cardiovascular ?negative cardio ROS ?Normal cardiovascular exam ? ? ?  ?Neuro/Psych ?PSYCHIATRIC DISORDERS Anxiety Depression negative neurological ROS ?   ? GI/Hepatic ?  ?Endo/Other  ? ? Renal/GU ?  ?negative genitourinary ?  ?Musculoskeletal ?negative musculoskeletal ROS ?(+)  ? Abdominal ?  ?Peds ?negative pediatric ROS ?(+)  Hematology ? ?(+) Blood dyscrasia, anemia ,   ?Anesthesia Other Findings ? ? Reproductive/Obstetrics ?negative OB ROS ?(+) Pregnancy ? ?  ? ? ? ? ? ? ? ? ? ? ? ? ? ?  ?  ? ? ? ? ? ? ? ? ?Anesthesia Physical ?Anesthesia Plan ? ?ASA: 2 ? ?Anesthesia Plan: Epidural  ? ?Post-op Pain Management:   ? ?Induction:  ? ?PONV Risk Score and Plan:  ? ?Airway Management Planned:  ? ?Additional Equipment:  ? ?Intra-op Plan:  ? ?Post-operative Plan:  ? ?Informed Consent:  ? ?Plan Discussed with: Anesthesiologist ? ?Anesthesia Plan Comments:   ? ? ? ? ? ? ?Anesthesia Quick Evaluation ? ?

## 2021-10-31 NOTE — Discharge Instructions (Signed)
Vaginal Delivery, Care After Refer to this sheet in the next few weeks. These discharge instructions provide you with information on caring for yourself after delivery. Your caregiver may also give you specific instructions. Your treatment has been planned according to the most current medical practices available, but problems sometimes occur. Call your caregiver if you have any problems or questions after you go home. HOME CARE INSTRUCTIONS Take over-the-counter or prescription medicines only as directed by your caregiver or pharmacist. Do not drink alcohol, especially if you are breastfeeding or taking medicine to relieve pain. Do not smoke tobacco. Continue to use good perineal care. Good perineal care includes: Wiping your perineum from back to front Keeping your perineum clean. You can do sitz baths twice a day, to help keep this area clean Do not use tampons, douche or have sex until your caregiver says it is okay. Shower only and avoid sitting in submerged water, aside from sitz baths Wear a well-fitting bra that provides breast support. Eat healthy foods. Drink enough fluids to keep your urine clear or pale yellow. Eat high-fiber foods such as whole grain cereals and breads, brown rice, beans, and fresh fruits and vegetables every day. These foods may help prevent or relieve constipation. Avoid constipation with high fiber foods or medications, such as miralax or metamucil Follow your caregiver's recommendations regarding resumption of activities such as climbing stairs, driving, lifting, exercising, or traveling. Talk to your caregiver about resuming sexual activities. Resumption of sexual activities is dependent upon your risk of infection, your rate of healing, and your comfort and desire to resume sexual activity. Try to have someone help you with your household activities and your newborn for at least a few days after you leave the hospital. Rest as much as possible. Try to rest or  take a nap when your newborn is sleeping. Increase your activities gradually. Keep all of your scheduled postpartum appointments. It is very important to keep your scheduled follow-up appointments. At these appointments, your caregiver will be checking to make sure that you are healing physically and emotionally. SEEK MEDICAL CARE IF:  You are passing large clots from your vagina. Save any clots to show your caregiver. You have a foul smelling discharge from your vagina. You have trouble urinating. You are urinating frequently. You have pain when you urinate. You have a change in your bowel movements. You have increasing redness, pain, or swelling near your vaginal incision (episiotomy) or vaginal tear. You have pus draining from your episiotomy or vaginal tear. Your episiotomy or vaginal tear is separating. You have painful, hard, or reddened breasts. You have a severe headache. You have blurred vision or see spots. You feel sad or depressed. You have thoughts of hurting yourself or your newborn. You have questions about your care, the care of your newborn, or medicines. You are dizzy or light-headed. You have a rash. You have nausea or vomiting. You were breastfeeding and have not had a menstrual period within 12 weeks after you stopped breastfeeding. You are not breastfeeding and have not had a menstrual period by the 12th week after delivery. You have a fever. SEEK IMMEDIATE MEDICAL CARE IF:  You have persistent pain. You have chest pain. You have shortness of breath. You faint. You have leg pain. You have stomach pain. Your vaginal bleeding saturates two or more sanitary pads in 1 hour. MAKE SURE YOU:  Understand these instructions. Will watch your condition. Will get help right away if you are not doing well or   get worse. Document Released: 08/14/2000 Document Revised: 01/01/2014 Document Reviewed: 04/13/2012 [employer] Patient Information 2015 [employer]. This  information is not intended to replace advice given to you by your health care provider. Make sure you discuss any questions you have with your health care provider.  Sitz Bath A sitz bath is a warm water bath taken in the sitting position. The water covers only the hips and butt (buttocks). We recommend using one that fits in the toilet, to help with ease of use and cleanliness. It may be used for either healing or cleaning purposes. Sitz baths are also used to relieve pain, itching, or muscle tightening (spasms). The water may contain medicine. Moist heat will help you heal and relax.  HOME CARE  Take 3 to 4 sitz baths a day. Fill the bathtub half-full with warm water. Sit in the water and open the drain a little. Turn on the warm water to keep the tub half-full. Keep the water running constantly. Soak in the water for 15 to 20 minutes. After the sitz bath, pat the affected area dry. GET HELP RIGHT AWAY IF: You get worse instead of better. Stop the sitz baths if you get worse. MAKE SURE YOU: Understand these instructions. Will watch your condition. Will get help right away if you are not doing well or get worse. Document Released: 09/24/2004 Document Revised: 05/11/2012 Document Reviewed: 12/15/2010 [employer] Patient Information 2015 [employer]. This information is not intended to replace advice given to you by your health care provider. Make sure you discuss any questions you have with your health care provider.   

## 2021-10-31 NOTE — H&P (Signed)
OB History & Physical  ? ?History of Present Illness:  ? ?Chief Complaint: scheduled IOL for elective at term ? ?HPI:  ?Gabriela Fuller is a 31 y.o. [phone] female at [redacted]w[redacted]d dated by [place] at [redacted]w[redacted]d, not c/w LMP (unknown).  She presents to L&D for scheduled IOL for elective at term ? ?Reports active fetal movement  ?Contractions: denies  ?LOF/SROM: denies  ?Vaginal bleeding: denies  ? ?Factors complicating pregnancy:  ?1. Encounter for elective induction of labor   ?2. Rubella non-immune status, antepartum   ?3. Susceptible to Varicella (non-immune), currently pregnant in third trimester   ?4. Anxiety and depression   ?5. Maternal iron deficiency anemia complicating pregnancy in third trimester   ? ? ?Patient Active Problem List  ? Diagnosis Date Noted  ? Rubella non-immune status, antepartum 10/25/2021  ? Susceptible to Varicella (non-immune), currently pregnant in third trimester 10/25/2021  ? Maternal iron deficiency anemia complicating pregnancy in third trimester 09/03/2021  ? Supervision of normal pregnancy 05/07/2021  ? Encounter for elective induction of labor 06/20/2020  ? Family history of developmental disability 03/09/2016  ? Anxiety and depression 12/09/2015  ? ? ? ?Maternal Medical History:  ? ?Past Medical History:  ?Diagnosis Date  ? Anxiety   ? ? ?Past Surgical History:  ?Procedure Laterality Date  ? NO PAST SURGERIES    ? ? ?No Known Allergies ? ?Prior to Admission medications   ?Medication Sig Start Date End Date Taking? Authorizing Provider  ?ferrous sulfate 325 (65 FE) MG tablet Take 1 tablet (325 mg total) by mouth daily with breakfast. 06/21/20   Haroldine Laws, CNM  ?Prenatal Vit-Fe Fumarate-FA (PRENATAL MULTIVITAMIN) TABS tablet Take 1 tablet by mouth daily at 12 noon.    [provider]  ?sertraline (ZOLOFT) 25 MG tablet Take 25 mg by mouth daily.    [provider]  ? ? ? ?Prenatal care site:  ?[facility] ? ?Social History: She  reports that she has never smoked.  She has never used smokeless tobacco. She reports that she does not drink alcohol and does not use drugs. ? ?Family History: family history is not on file.  ? ?Review of Systems: A full review of systems was performed and negative except as noted in the HPI.   ? ? ?Physical Exam:  ?Vital Signs: BP 118/72 (BP Location: Right Arm)   Pulse 94   Temp 97.8 ?F (36.6 ?C) (Oral)   Resp 16   Ht 5\' 7"  (1.702 m)   Wt 83.9 kg   LMP  (LMP Unknown)   SpO2 100%   BMI 28.98 kg/m?  ?Physical Exam ? ?General: no acute distress.  ?HEENT: normocephalic, atraumatic ?Heart: regular rate & rhythm.  No murmurs/rubs/gallops ?Lungs: clear to auscultation bilaterally, normal respiratory effort ?Abdomen: soft, gravid, non-tender;  EFW: 7 1/2 lbs  ?Pelvic:  ? External: Normal external female genitalia ? Cervix: Dilation: 2 / Effacement (%): 60 / Station: -2  ?  ?Extremities: non-tender, symmetric, no edema bilaterally.  DTRs: 2+/2+  ?Neurologic: Alert & oriented x 3.   ? ?Results for orders placed or performed during the hospital encounter of 10/31/21 (from the past 24 hour(s))  ?CBC     Status: Abnormal  ? Collection Time: 10/31/21  8:34 AM  ?Result Value Ref Range  ? WBC 8.0 4.0 - 10.5 K/uL  ? RBC 4.29 3.87 - 5.11 MIL/uL  ? Hemoglobin 11.5 (L) 12.0 - 15.0 g/dL  ? HCT 35.3 (L) 36.0 - 46.0 %  ? MCV  82.3 80.0 - 100.0 fL  ? MCH 26.8 26.0 - 34.0 pg  ? MCHC 32.6 30.0 - 36.0 g/dL  ? RDW 13.1 11.5 - 15.5 %  ? Platelets 167 150 - 400 K/uL  ? nRBC 0.0 0.0 - 0.2 %  ?Type and screen     Status: None  ? Collection Time: 10/31/21  8:34 AM  ?Result Value Ref Range  ? ABO/RH(D) O POS   ? Antibody Screen NEG   ? Sample Expiration    ?  11/03/2021,2359 ?Performed at [facility], [address] [place], [place] [address] ?  ?Resp Panel by RT-PCR (Flu A&B, Covid) Nasopharyngeal Swab     Status: None  ? Collection Time: 10/31/21  8:34 AM  ? Specimen: Nasopharyngeal Swab; Nasopharyngeal(NP) swabs in vial transport medium  ?Result Value Ref  Range  ? SARS Coronavirus 2 by RT PCR NEGATIVE NEGATIVE  ? Influenza A by PCR NEGATIVE NEGATIVE  ? Influenza B by PCR NEGATIVE NEGATIVE  ? ? ?Pertinent Results:  ?Prenatal Labs: ?Blood type/Rh O pos  ?Antibody screen neg  ?Rubella Non-Immune - Vaccine x 2  ?Varicella Non-Immune - Vaccine x 2  ?RPR NR  ?HBsAg Neg  ?HIV NR  ?GC neg  ?Chlamydia neg  ?Genetic screening cfDNA negative  ?1 hour GTT 101  ?3 hour GTT N/A  ?GBS Neg  ? ?FHT:  FHR: 145 bpm, variability: moderate,  accelerations:  Present,  decelerations:  Absent ?Category/reactivity:  Category I ?UC:   irregular, mild contractions  ?  ?Cephalic by Leopolds and SVE  ? ?No results found. ? ?Assessment:  ?Deloras Reichard is a 31 y.o. [phone] female at [redacted]w[redacted]d with elective IOL at term.  ? ?Plan:  ?1. Admit to Labor & Delivery; consents reviewed and obtained ?- Covid admission screen  ? ?2. Fetal Well being  ?- Fetal Tracing: cat 1 ?- Group B Streptococcus ppx not indicated: GBS neg ?- Presentation: cephalic confirmed by SVE  ? ?3. Routine OB: ?- Prenatal labs reviewed, as above ?- Rh pos ?- CBC, T&S, RPR on admit ?- Regular diet, saline lock ? ?4. Monitoring of labor  ?- Contractions monitored with external toco ?- Pelvis adequate for trial of labor  ?- Plan for induction with misoprostol  ?- Induction with oxytocin and AROM as appropriate  ?- Plan for  continuous fetal monitoring ?- Maternal pain control as desired; planning regional anesthesia ?- Anticipate vaginal delivery ? ?5. Post Partum Planning: ?- Infant feeding: Breast ?- Contraception: considering ParaGard IUD  ?- Tdap vaccine: declined  ?- Flu vaccine: declined  ? ?Gustavo Lah, CNM ?10/31/21 ?12:38 PM ? ?Margaretmary Eddy, CNM ?Certified Nurse Midwife ?[facility] ?[facility]  ? ? ? ?

## 2021-10-31 NOTE — Discharge Summary (Signed)
Obstetrical Discharge Summary ? ?Patient Name: Gabriela Fuller ?DOB: 13-Sep-1990 ?MRN: [id number] ? ?Date of Admission: 10/31/2021 ?Date of Delivery: 10/31/2021 ?Delivered by: Margaretmary Eddy, CNM  ?Date of Discharge: 11/01/2021 ? ?Primary OB: [facility] ?LMP:No LMP recorded (lmp unknown). Patient is pregnant. ?EDC Estimated Date of Delivery: 11/04/21 ?Gestational Age at Delivery: [redacted]w[redacted]d  ? ?Antepartum complications:  ?1. Encounter for elective induction of labor   ?2. Rubella non-immune status, antepartum   ?3. Susceptible to Varicella (non-immune), currently pregnant in third trimester   ?4. Anxiety and depression   ?5. Maternal iron deficiency anemia complicating pregnancy in third trimester   ? ? ?Admitting Diagnosis: Encounter for elective induction of labor [Z34.90]  ?Secondary Diagnosis: ?Patient Active Problem List  ? Diagnosis Date Noted  ? Rubella non-immune status, antepartum 10/25/2021  ? Susceptible to Varicella (non-immune), currently pregnant in third trimester 10/25/2021  ? Maternal iron deficiency anemia complicating pregnancy in third trimester 09/03/2021  ? Supervision of normal pregnancy 05/07/2021  ? Encounter for elective induction of labor 06/20/2020  ? Family history of developmental disability 03/09/2016  ? Anxiety and depression 12/09/2015  ? ? ?Induction: Pitocin and Cytotec ?Complications: None ?Intrapartum complications/course: Aarti presented to L&D for scheduled elective IOL at term.  She progressed well after 1 dose of misoprostol and low dose oxytocin.  Membranes spontaneously ruptured and she progressed quickly to C/C/+3 with an urge to push.  Darrah pushed quickly over approximately 11 minutes for a spontaneous vaginal birth in hands and knees position.  ?Delivery Type: spontaneous vaginal delivery ?Anesthesia: epidural ?Placenta: spontaneous ?Laceration: none ?Episiotomy: none ?Newborn Data: ?Live born female "Wyette" ?Birth Weight:  8#5 ?APGAR: 9, 9 ? ?Newborn Delivery    ?Birth date/time: 10/31/2021 17:36:00 ?Delivery type: Vaginal, Spontaneous ?  ?  ?Postpartum Procedures: none ? ?Edinburgh:  ?Inocente Salles Postnatal Depression Scale Screening Tool 06/21/2020 06/21/2020  ?I have been able to laugh and see the funny side of things. 0 (No Data)  ?I have looked forward with enjoyment to things. 0 -  ?I have blamed myself unnecessarily when things went wrong. 1 -  ?I have been anxious or worried for no good reason. 0 -  ?I have felt scared or panicky for no good reason. 0 -  ?Things have been getting on top of me. 1 -  ?I have been so unhappy that I have had difficulty sleeping. 0 -  ?I have felt sad or miserable. 0 -  ?I have been so unhappy that I have been crying. 0 -  ?The thought of harming myself has occurred to me. 0 -  ?Edinburgh Postnatal Depression Scale Total 2 -  ?  ? ?Post partum course:  ?Patient had an uncomplicated postpartum course.  By time of discharge on PPD#1, her pain was controlled on oral pain medications; she had appropriate lochia and was ambulating, voiding without difficulty and tolerating regular diet.  She was deemed stable for discharge to home.    ? ?Discharge Physical Exam:  ?BP 106/73 (BP Location: Right Arm)   Pulse 61   Temp 98.1 ?F (36.7 ?C) (Oral)   Resp 18   Ht 5\' 7"  (1.702 m)   Wt 83.9 kg   LMP  (LMP Unknown)   SpO2 100%   Breastfeeding Unknown   BMI 28.98 kg/m?  ? ?General: NAD ?CV: RRR ?Pulm: CTABL, nl effort ?ABD: s/nd/nt, fundus firm and below the umbilicus ?Lochia: moderate ?Perineum:minimal edema/intact ?DVT Evaluation: LE non-ttp, no evidence of DVT on exam. ? ?Hemoglobin  ?Date Value  Ref Range Status  ?11/01/2021 9.8 (L) 12.0 - 15.0 g/dL Final  ? ?HGB  ?Date Value Ref Range Status  ?09/25/2014 12.8 12.0 - 16.0 g/dL Final  ? ?HCT  ?Date Value Ref Range Status  ?11/01/2021 30.5 (L) 36.0 - 46.0 % Final  ?09/25/2014 38.8 35.0 - 47.0 % Final  ? ? ? ?Disposition: stable, discharge to home. ?Baby Feeding: breastmilk  ?Baby Disposition: home  with mom ? ?Rh Immune globulin given: pos ?Rubella vaccine given: Non-immune - declined prior to DC ?Varivax vaccine given: Non-immune -declined prior to DC ?Flu vaccine given in AP or PP setting: declined  ?Tdap vaccine given in AP or PP setting: declined  ? ?Contraception: Paragard ? ?Prenatal Labs:  ?Blood type/Rh O pos  ?Antibody screen neg  ?Rubella Non-Immune - Vaccine x 2  ?Varicella Non-Immune - Vaccine x 2  ?RPR NR  ?HBsAg Neg  ?HIV NR  ?GC neg  ?Chlamydia neg  ?Genetic screening cfDNA negative  ?1 hour GTT 101  ?3 hour GTT N/A  ?GBS Neg  ? ? ?Plan:  ?Bradleigh Sonnen was discharged to home in good condition. ?[facility] appointment with [facility] in 2 weeks for postpartum mood check and with delivering provider in 6 weeks. ? ?Discharge Medications: ?Allergies as of 11/01/2021   ?No Known Allergies ?  ? ?  ?Medication List  ?  ? ?TAKE these medications   ? ?acetaminophen 500 MG tablet ?Commonly known as: TYLENOL ?Take 2 tablets (1,000 mg total) by mouth every 6 (six) hours as needed (for pain scale < 4). ?  ?benzocaine-Menthol 20-0.5 % Aero ?Commonly known as: DERMOPLAST ?Apply 1 application topically as needed for irritation (perineal discomfort). ?  ?coconut oil Oil ?Apply 1 application topically as needed. ?  ?docusate sodium 100 MG capsule ?Commonly known as: COLACE ?Take 1 capsule (100 mg total) by mouth 2 (two) times daily. ?  ?ferrous sulfate 325 (65 FE) MG tablet ?Take 1 tablet (325 mg total) by mouth daily with breakfast. ?  ?ibuprofen 600 MG tablet ?Commonly known as: ADVIL ?Take 1 tablet (600 mg total) by mouth every 6 (six) hours. ?  ?magnesium 30 MG tablet ?Take 30 mg by mouth 2 (two) times daily. ?  ?prenatal multivitamin Tabs tablet ?Take 1 tablet by mouth daily at 12 noon. ?  ?sertraline 25 MG tablet ?Commonly known as: ZOLOFT ?Take 25 mg by mouth daily. ?  ?witch hazel-glycerin pad ?Commonly known as: TUCKS ?Apply 1 application topically continuous. ?  ? ?  ? ? ? Follow-up Information   ? ? Gustavo Lah, CNM. Schedule an appointment as soon as possible for a visit in 2 week(s).   ?Specialty: Certified Nurse Midwife ?Why: postpartum mood check ?Contact information: ?[address] ?[place] [address] ?[phone] ? ? ?  ?  ? ? Gustavo Lah, CNM. Schedule an appointment as soon as possible for a visit in 6 week(s).   ?Specialty: Certified Nurse Midwife ?Why: postpartum visit with Paragard IUD ?Contact information: ?[address] ?[place] [address] ?[phone] ? ? ?  ?  ? ?  ?  ? ?  ? ? ?Signed: ?Randa Ngo, CNM ?11/01/2021 ?9:15 AM ? ? ?

## 2021-10-31 NOTE — Progress Notes (Signed)
Labor Progress Note ? ?Gabriela Fuller is a 31 y.o. [phone] at [redacted]w[redacted]d by ultrasound admitted for induction of labor due to Elective at term. ? ?Subjective: feeling frequent contractions  ? ?Objective: ?BP 122/70   Pulse 75   Temp (!) 97.5 ?F (36.4 ?C) (Oral)   Resp 16   Ht 5\' 7"  (1.702 m)   Wt 83.9 kg   LMP  (LMP Unknown)   SpO2 100%   BMI 28.98 kg/m?  ?Notable VS details: reviewed  ? ?Fetal Assessment: ?FHT:  FHR: 135 bpm, variability: moderate,  accelerations:  Present,  decelerations:  Absent ?Category/reactivity:  Category I ?UC:   regular, every 2-3 minutes ?SVE:   3/60/-2/ballotable/mid/soft ?Membrane status: Intact ?Amniotic color: N/A ? ?Labs: ?Lab Results  ?Component Value Date  ? WBC 8.0 10/31/2021  ? HGB 11.5 (L) 10/31/2021  ? HCT 35.3 (L) 10/31/2021  ? MCV 82.3 10/31/2021  ? PLT 167 10/31/2021  ? ? ?Assessment / Plan: ?Induction of labor d/t elective at term ?-s/p 1 dose of vaginal and buccal misoprostol  ?-Will start oxytocin for continued active management of labor  ? ?Labor: Progressing normally ?Fetal Wellbeing:  Category I ?Pain Control:  Labor support without medications ?I/D:  n/a ?Anticipated MOD:  NSVD ? ?Gabriela Fuller, CNM ?10/31/2021, 1:27 PM ? ? ? ? ? ? ? ? ?

## 2021-11-01 LAB — CBC
HCT: 30.5 % — ABNORMAL LOW (ref 36.0–46.0)
Hemoglobin: 9.8 g/dL — ABNORMAL LOW (ref 12.0–15.0)
MCH: 26.4 pg (ref 26.0–34.0)
MCHC: 32.1 g/dL (ref 30.0–36.0)
MCV: 82.2 fL (ref 80.0–100.0)
Platelets: 166 10*3/uL (ref 150–400)
RBC: 3.71 MIL/uL — ABNORMAL LOW (ref 3.87–5.11)
RDW: 12.9 % (ref 11.5–15.5)
WBC: 11.4 10*3/uL — ABNORMAL HIGH (ref 4.0–10.5)
nRBC: 0 % (ref 0.0–0.2)

## 2021-11-01 LAB — RPR: RPR Ser Ql: NONREACTIVE

## 2021-11-01 MED ORDER — BENZOCAINE-MENTHOL 20-0.5 % EX AERO: 1.0000 "application " | INHALATION_SPRAY | CUTANEOUS | Status: AC | PRN

## 2021-11-01 MED ORDER — IBUPROFEN 600 MG PO TABS: 600.0000 mg | ORAL_TABLET | Freq: Four times a day (QID) | ORAL | 0 refills | Status: AC

## 2021-11-01 MED ORDER — COCONUT OIL OIL: 1.0000 "application " | TOPICAL_OIL | 0 refills | Status: AC | PRN

## 2021-11-01 MED ORDER — DOCUSATE SODIUM 100 MG PO CAPS: 100.0000 mg | ORAL_CAPSULE | Freq: Two times a day (BID) | ORAL | 0 refills | Status: AC

## 2021-11-01 MED ORDER — WITCH HAZEL-GLYCERIN EX PADS: 1.0000 "application " | MEDICATED_PAD | CUTANEOUS | 12 refills | Status: AC

## 2021-11-01 MED ORDER — ACETAMINOPHEN 500 MG PO TABS: 1000.0000 mg | ORAL_TABLET | Freq: Four times a day (QID) | ORAL | 0 refills | Status: AC | PRN

## 2021-11-01 NOTE — Progress Notes (Signed)
Patient discharged and is rooming in with infant. Rx sent to pharmacy. PP instructions given and reviewed with patient. Patient verbalized understanding.  ? ?Gabriela Fuller ?11/01/2021 ?5:36 PM ? ?

## 2021-11-01 NOTE — Lactation Note (Signed)
This note was copied from a baby's chart. ?Lactation Consultation Note ? ?Patient Name: Gabriela Fuller ?Today's Date: 11/01/2021 ?Reason for consult: Initial assessment ?Age:30 hours ?Lactation to the room for initial visit. Mother is holding the baby in reclined position and baby is latched onto the left. Mother stated baby she struggles to get baby to open her mouth very wide. Encouraged to sandwich breast tissue, stroke nipple nose to chin and wait for a wide gape. Discussed not pulling areola tissue away from baby but rather compressing the tissue toward her. Encouraged feeding on demand and with cues. If baby is not cueing encouraged hand expression and skin to skin. Baby was left feeding with Mother. She stated she is using coconut oil after feeds on her nipple.  Encouraged 8 or more attempts in the first 24 hours and 8 or more good feeds after 24 HOL. Reviewed appropriate diapers for days of life and How to know your baby is getting enough to eat. Reviewed "Understanding Postpartum and Newborn Care" booklet at bedside. [facility] # left on board, encouraged to call for any assistance. Mother has no further questions at this time.  ? ?Maternal Data ?Has patient been taught Hand Expression?: Yes ?Does the patient have breastfeeding experience prior to this delivery?: Yes ? ?Feeding ?Mother's Current Feeding Choice: Breast Milk ? ?LATCH Score ?Latch: Grasps breast easily, tongue down, lips flanged, rhythmical sucking. ? ?Audible Swallowing: Spontaneous and intermittent ? ?Type of Nipple: Everted at rest and after stimulation ? ?Comfort (Breast/Nipple): Filling, red/small blisters or bruises, mild/mod discomfort ? ?Hold (Positioning): Assistance needed to correctly position infant at breast and maintain latch. ? ?LATCH Score: 8 ? ?Interventions ?Interventions: Breast feeding basics reviewed;Assisted with latch;Adjust position;Education ? ?Discharge ?Discharge Education: Engorgement and breast care;Warning signs for  feeding baby ?Pump: Manual;Personal (has a manual pump at home) ? ?Consult Status ?Consult Status: PRN ? ? ? ?Johnie Stadel D Amberrose Friebel ?11/01/2021, 2:14 PM ? ? ? ?

## 2021-11-10 DIAGNOSIS — R102 Pelvic and perineal pain: Secondary | ICD-10-CM | POA: Diagnosis not present

## 2021-12-11 IMAGING — CT CT ABD-PELV W/O CM
2 of 4 series · 15 of 46 positions shown, 17 images · non-contrast
Comparison: Right upper quadrant ultrasound 12/24/2017. CT
09/26/2014.

CLINICAL DATA: Abdominal pain to right lower quadrant for 4 days
with fever. Vaginal delivery, postpartum 6 months ago.

EXAM:
CT ABDOMEN AND PELVIS WITHOUT CONTRAST
TECHNIQUE: Multidetector CT imaging of the abdomen and pelvis was performed
following the standard protocol without IV contrast.

[Series 2: axial st · axial · 0.73mm/px · z∈[-454,-24]mm · 12 of 94 slices shown, 14 images]
[im 4/94  soft-tissue]
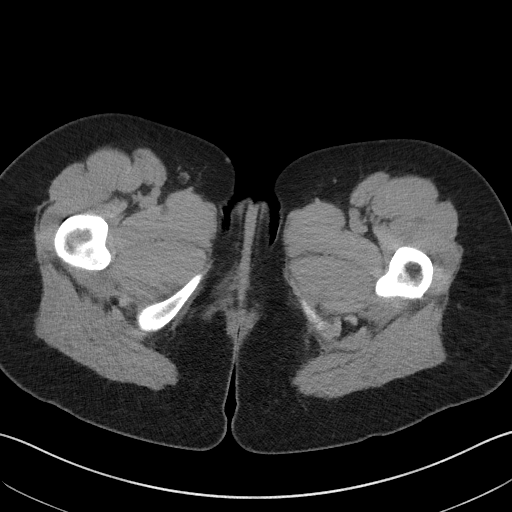
[im 4/94  bone]
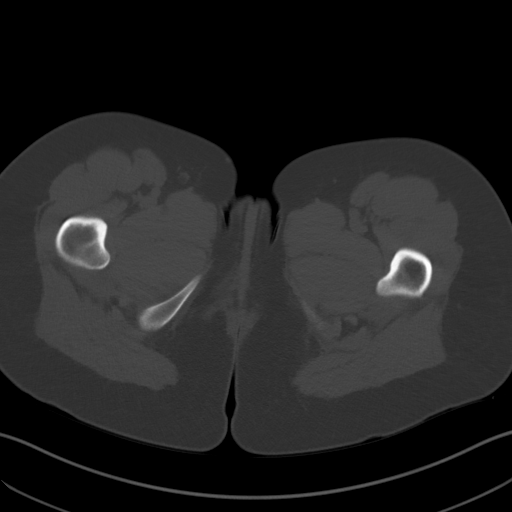
[im 12/94  soft-tissue]
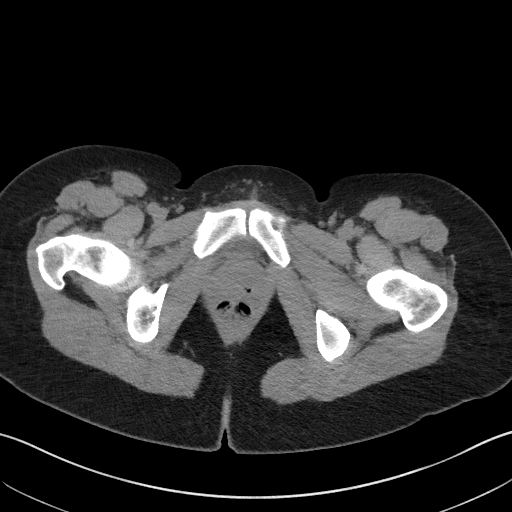
[im 20/94  soft-tissue]
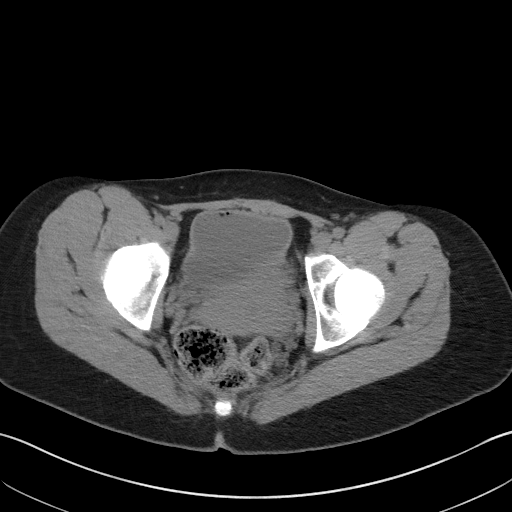
[im 28/94  soft-tissue]
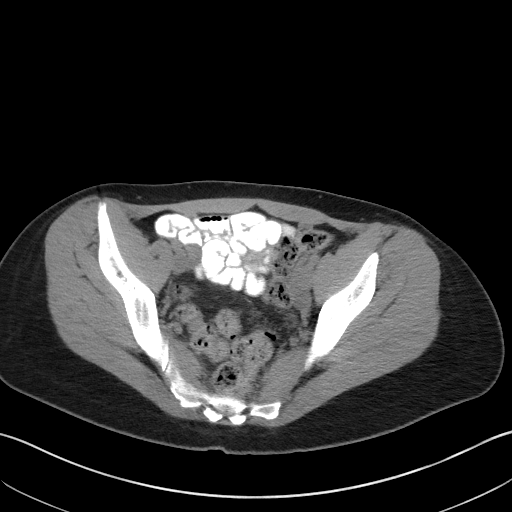
[im 35/94  soft-tissue]
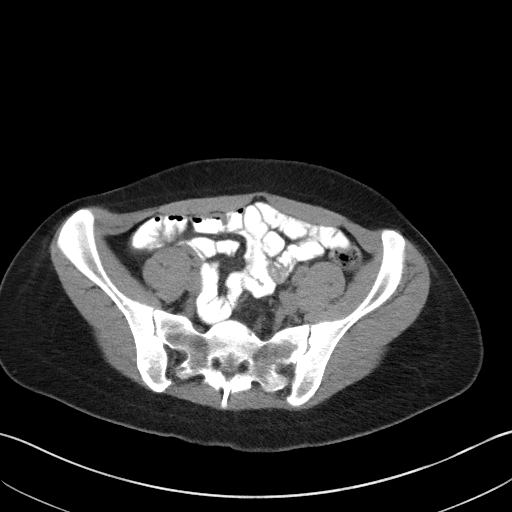
[im 43/94  soft-tissue]
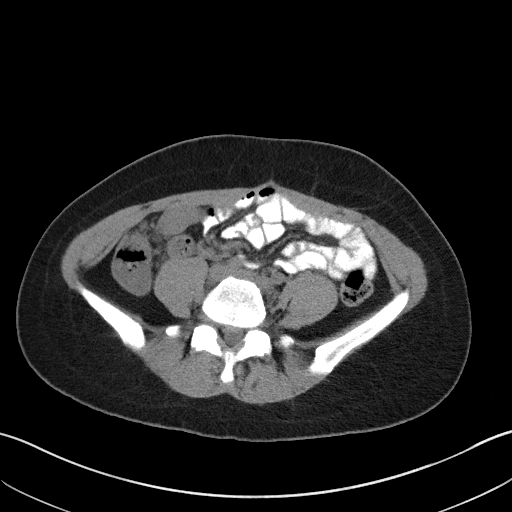
[im 51/94  soft-tissue]
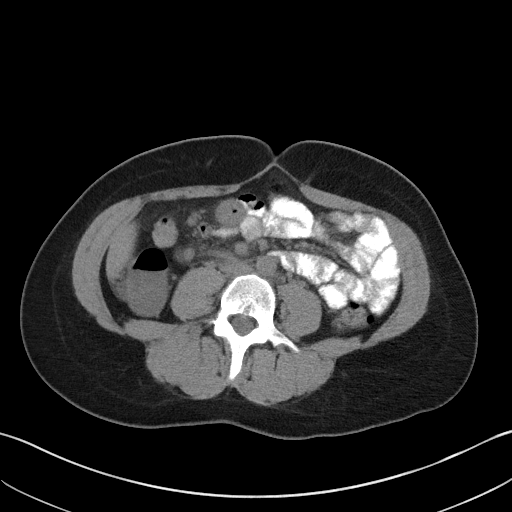
[im 59/94  soft-tissue]
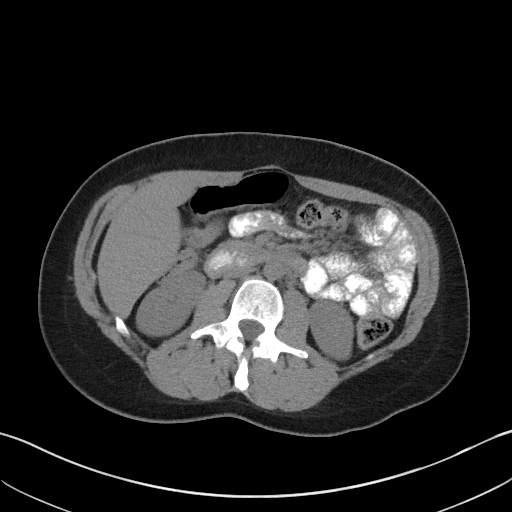
[im 66/94  soft-tissue]
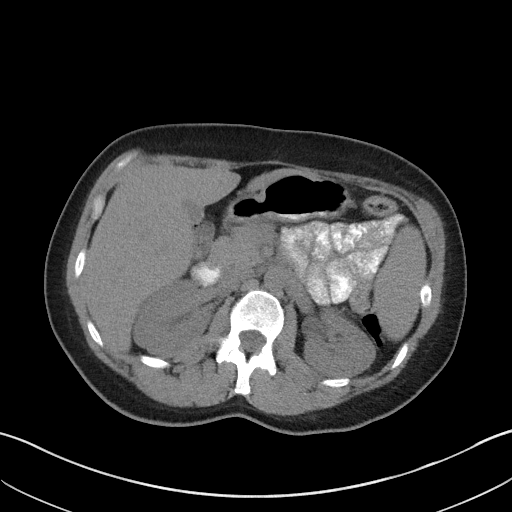
[im 66/94  bone]
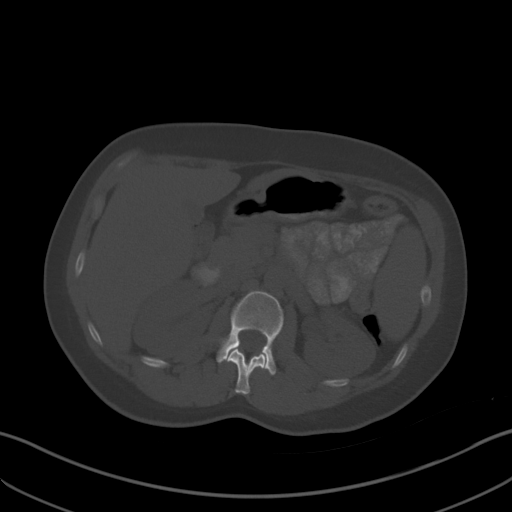
[im 74/94  soft-tissue]
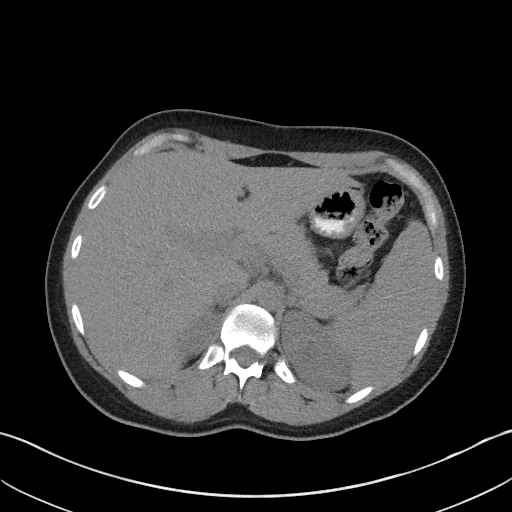
[im 82/94  soft-tissue]
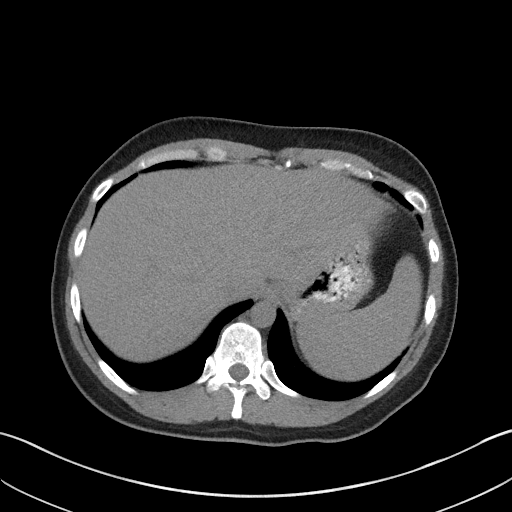
[im 90/94  soft-tissue]
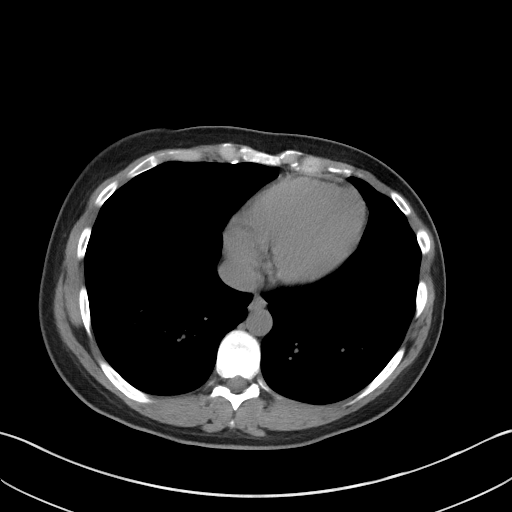

[Series 5: coronal st · coronal · 0.70mm/px · 3 of 85 slices shown]
[im 29/85  soft-tissue]
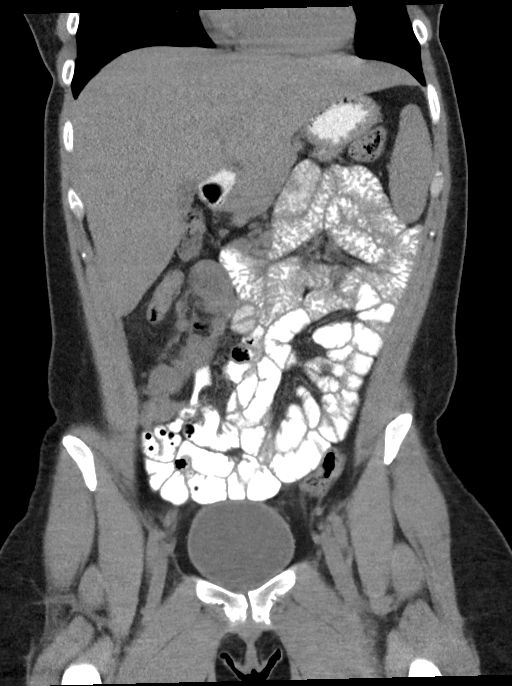
[im 38/85  soft-tissue]
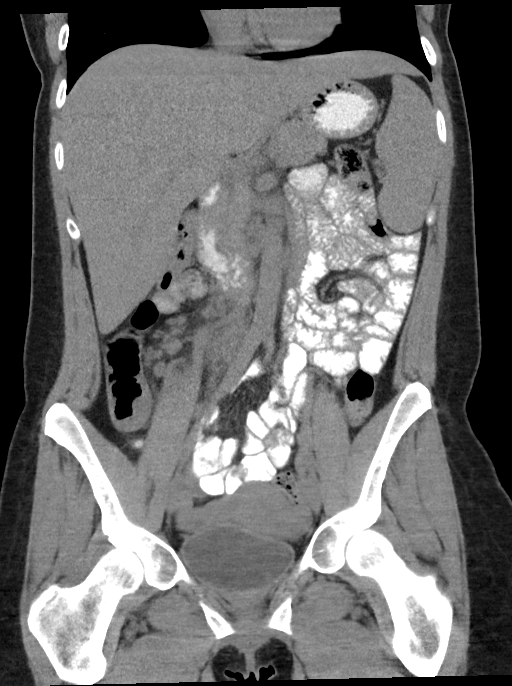
[im 47/85  soft-tissue]
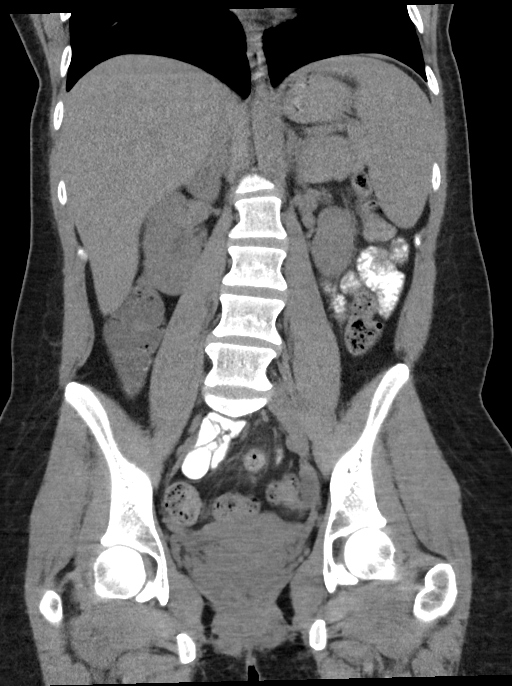

[15 of 46 positions shown; findings below may reference images not displayed]

FINDINGS: Lower chest: Clear lung bases. Normal heart size without pericardial
or pleural effusion.

Hepatobiliary: Hepatomegaly at 19.3 cm craniocaudal. Normal
gallbladder, without biliary ductal dilatation.

Pancreas: Normal, without mass or ductal dilatation.

Spleen: Normal in size, without focal abnormality.

Adrenals/Urinary Tract: Normal adrenal glands. No renal calculi or
hydronephrosis. Ureters are difficult to follow. No bladder calculi.
Trace air in the nondependent urinary bladder.

Stomach/Bowel: Normal stomach, without wall thickening. Colonic
stool burden suggests constipation. Normal colon. Enteric contrast
is only present up to the mid ileum. A relatively long segment of
distal ileum is poorly opacified but appears mildly thick walled
with subtle surrounding edema, including on 42/2 and coronal image
28.

Appendix normal including on coronal images 38 and 34. The more
proximal small bowel is well opacified and within normal limits.

Vascular/Lymphatic: Normal caliber of the aorta and branch vessels.
Mildly prominent ileocolic mesenteric nodes are likely reactive.

Reproductive: Normal uterus and adnexa.

Other: No significant free fluid.  No free intraperitoneal air.

Musculoskeletal: Limbus vertebrae at L4.  Disc bulge at L4-5.

Mild convex left lumbar spine curvature.
IMPRESSION: 1. Poor enteric opacification of the distal small bowel and colon.
Apparent wall thickening involving the distal ileum, suspicious for
either infectious or inflammatory enteritis. Recommend clinical
exclusion of Crohn disease.
2. Normal appendix.
3. Hepatomegaly.
4. Trace air in the bladder is presumably iatrogenic. Correlate with
instrumentation.

## 2021-12-22 DIAGNOSIS — Z30430 Encounter for insertion of intrauterine contraceptive device: Secondary | ICD-10-CM | POA: Diagnosis not present

## 2022-02-03 DIAGNOSIS — Z30431 Encounter for routine checking of intrauterine contraceptive device: Secondary | ICD-10-CM | POA: Diagnosis not present

## 2022-03-12 DIAGNOSIS — Z6825 Body mass index (BMI) 25.0-25.9, adult: Secondary | ICD-10-CM | POA: Diagnosis not present

## 2022-03-12 DIAGNOSIS — J029 Acute pharyngitis, unspecified: Secondary | ICD-10-CM | POA: Diagnosis not present

## 2022-03-27 DIAGNOSIS — J019 Acute sinusitis, unspecified: Secondary | ICD-10-CM | POA: Diagnosis not present

## 2022-03-27 DIAGNOSIS — H109 Unspecified conjunctivitis: Secondary | ICD-10-CM | POA: Diagnosis not present

## 2022-04-09 DIAGNOSIS — Z3482 Encounter for supervision of other normal pregnancy, second trimester: Secondary | ICD-10-CM | POA: Diagnosis not present

## 2022-04-09 DIAGNOSIS — Z3483 Encounter for supervision of other normal pregnancy, third trimester: Secondary | ICD-10-CM | POA: Diagnosis not present

## 2022-11-23 DIAGNOSIS — F419 Anxiety disorder, unspecified: Secondary | ICD-10-CM | POA: Diagnosis not present

## 2022-11-23 DIAGNOSIS — F32A Depression, unspecified: Secondary | ICD-10-CM | POA: Diagnosis not present

## 2022-11-23 DIAGNOSIS — F411 Generalized anxiety disorder: Secondary | ICD-10-CM | POA: Diagnosis not present

## 2022-11-23 DIAGNOSIS — F4320 Adjustment disorder, unspecified: Secondary | ICD-10-CM | POA: Diagnosis not present

## 2022-11-23 DIAGNOSIS — R399 Unspecified symptoms and signs involving the genitourinary system: Secondary | ICD-10-CM | POA: Diagnosis not present

## 2022-11-23 DIAGNOSIS — Z09 Encounter for follow-up examination after completed treatment for conditions other than malignant neoplasm: Secondary | ICD-10-CM | POA: Diagnosis not present

## 2022-11-23 DIAGNOSIS — I83811 Varicose veins of right lower extremities with pain: Secondary | ICD-10-CM | POA: Diagnosis not present

## 2022-11-23 DIAGNOSIS — I83892 Varicose veins of left lower extremities with other complications: Secondary | ICD-10-CM | POA: Diagnosis not present

## 2022-11-30 DIAGNOSIS — F4320 Adjustment disorder, unspecified: Secondary | ICD-10-CM | POA: Diagnosis not present

## 2022-11-30 DIAGNOSIS — F411 Generalized anxiety disorder: Secondary | ICD-10-CM | POA: Diagnosis not present

## 2022-12-11 DIAGNOSIS — F4320 Adjustment disorder, unspecified: Secondary | ICD-10-CM | POA: Diagnosis not present

## 2022-12-11 DIAGNOSIS — F411 Generalized anxiety disorder: Secondary | ICD-10-CM | POA: Diagnosis not present

## 2022-12-21 DIAGNOSIS — F411 Generalized anxiety disorder: Secondary | ICD-10-CM | POA: Diagnosis not present

## 2022-12-21 DIAGNOSIS — F4320 Adjustment disorder, unspecified: Secondary | ICD-10-CM | POA: Diagnosis not present

## 2022-12-28 DIAGNOSIS — F4320 Adjustment disorder, unspecified: Secondary | ICD-10-CM | POA: Diagnosis not present

## 2022-12-28 DIAGNOSIS — F411 Generalized anxiety disorder: Secondary | ICD-10-CM | POA: Diagnosis not present

## 2023-01-04 DIAGNOSIS — F4320 Adjustment disorder, unspecified: Secondary | ICD-10-CM | POA: Diagnosis not present

## 2023-01-04 DIAGNOSIS — F411 Generalized anxiety disorder: Secondary | ICD-10-CM | POA: Diagnosis not present

## 2023-01-07 DIAGNOSIS — F419 Anxiety disorder, unspecified: Secondary | ICD-10-CM | POA: Diagnosis not present

## 2023-01-07 DIAGNOSIS — F32A Depression, unspecified: Secondary | ICD-10-CM | POA: Diagnosis not present

## 2023-01-11 DIAGNOSIS — I872 Venous insufficiency (chronic) (peripheral): Secondary | ICD-10-CM | POA: Diagnosis not present

## 2023-01-11 DIAGNOSIS — I83891 Varicose veins of right lower extremities with other complications: Secondary | ICD-10-CM | POA: Diagnosis not present

## 2023-01-12 DIAGNOSIS — F411 Generalized anxiety disorder: Secondary | ICD-10-CM | POA: Diagnosis not present

## 2023-01-12 DIAGNOSIS — F4320 Adjustment disorder, unspecified: Secondary | ICD-10-CM | POA: Diagnosis not present

## 2023-02-01 DIAGNOSIS — F411 Generalized anxiety disorder: Secondary | ICD-10-CM | POA: Diagnosis not present

## 2023-02-01 DIAGNOSIS — F4320 Adjustment disorder, unspecified: Secondary | ICD-10-CM | POA: Diagnosis not present

## 2023-04-09 ENCOUNTER — Encounter: Payer: Self-pay | Admitting: Emergency Medicine

## 2023-04-09 ENCOUNTER — Ambulatory Visit
Admission: EM | Admit: 2023-04-09 | Discharge: 2023-04-09 | Disposition: A | Discharge status: Home / Self Care | Payer: BLUE CROSS/BLUE SHIELD | Attending: Emergency Medicine | Admitting: Emergency Medicine

## 2023-04-09 ENCOUNTER — Ambulatory Visit (INDEPENDENT_AMBULATORY_CARE_PROVIDER_SITE_OTHER): Payer: BLUE CROSS/BLUE SHIELD

## 2023-04-09 DIAGNOSIS — M545 Low back pain, unspecified: Secondary | ICD-10-CM | POA: Diagnosis not present

## 2023-04-09 DIAGNOSIS — G8929 Other chronic pain: Secondary | ICD-10-CM | POA: Insufficient documentation

## 2023-04-09 DIAGNOSIS — M47816 Spondylosis without myelopathy or radiculopathy, lumbar region: Secondary | ICD-10-CM | POA: Diagnosis not present

## 2023-04-09 DIAGNOSIS — M549 Dorsalgia, unspecified: Secondary | ICD-10-CM | POA: Diagnosis not present

## 2023-04-09 LAB — URINALYSIS, W/ REFLEX TO CULTURE (INFECTION SUSPECTED)
Bilirubin Urine: NEGATIVE
Glucose, UA: NEGATIVE mg/dL
Hgb urine dipstick: NEGATIVE
Ketones, ur: NEGATIVE mg/dL
Leukocytes,Ua: NEGATIVE
Nitrite: NEGATIVE
Protein, ur: NEGATIVE mg/dL
Specific Gravity, Urine: 1.02 (ref 1.005–1.030)
pH: 6 (ref 5.0–8.0)

## 2023-04-09 LAB — PREGNANCY, URINE: Preg Test, Ur: NEGATIVE

## 2023-04-09 MED ORDER — KETOROLAC TROMETHAMINE 30 MG/ML IJ SOLN
30.0000 mg | Freq: Once | INTRAMUSCULAR | Status: AC
  Administered 2023-04-09: 30 mg via INTRAMUSCULAR

## 2023-04-09 MED ORDER — KETOROLAC TROMETHAMINE 30 MG/ML IJ SOLN: 30.0000 mg | Freq: Once | INTRAMUSCULAR | Status: DC

## 2023-04-09 MED ORDER — METAXALONE 800 MG PO TABS: 800.0000 mg | ORAL_TABLET | Freq: Three times a day (TID) | ORAL | 0 refills | Status: AC

## 2023-04-09 NOTE — ED Triage Notes (Signed)
Patient reports lower back pain that started yesterday.  Patient denies any injury or fall.  Patient has alternated with Tylenol and Ibuprofen.

## 2023-04-09 NOTE — Discharge Instructions (Addendum)
I will send you a mychart message when we receive your xray report In the mean time I have sent a muscle relaxer for your back I do recommend to see your chiropractor since this has helped in the past.  It is OK to take Ibuprofen up to 800 mg every 8 hours or Tylenol up to 1000 mg every 8 hours as needed for pain.

## 2023-04-09 NOTE — ED Provider Notes (Signed)
[facility]    CSN: [id number] Arrival date & time: 04/09/23  1926      History   Chief Complaint Chief Complaint  Patient presents with   Back Pain    HPI Gabriela Fuller is a 32 y.o. female who presents with low back pain since yesterday morning. She has had pain like this before only when pregnant and would go to her chiropractor and would get better, but because she would be pregnant so they could not xray her. Has been alternating Ibuprofen and Tylenol. The last dose of Ibuprofen was this am. She took a muscle relaxer from her mother a couple of hours ago which helped reduce the pain from 7/10 to 5/10. Pain is provoked with spine movement and trying to get up from a sitting position. Pain does not radiate to her legs.     Past Medical History:  Diagnosis Date   Anxiety     Patient Active Problem List   Diagnosis Date Noted   Rubella non-immune status, antepartum 10/25/2021   Susceptible to Varicella (non-immune), currently pregnant in third trimester 10/25/2021   Maternal iron deficiency anemia complicating pregnancy in third trimester 09/03/2021   Supervision of normal pregnancy 05/07/2021   Encounter for elective induction of labor 06/20/2020   Family history of developmental disability 03/09/2016   Anxiety and depression 12/09/2015    Past Surgical History:  Procedure Laterality Date   NO PAST SURGERIES      OB History     Gravida  5   Para  4   Term  4   Preterm      AB  1   Living  4      SAB  1   IAB      Ectopic      Multiple  0   Live Births  4            Home Medications    Prior to Admission medications   Medication Sig Start Date End Date Taking? Authorizing Provider  acetaminophen (TYLENOL) 500 MG tablet Take 2 tablets (1,000 mg total) by mouth every 6 (six) hours as needed (for pain scale < 4). 11/01/21   McVey, Prudencio Pair, CNM  benzocaine-Menthol (DERMOPLAST) 20-0.5 % AERO Apply 1 application topically as needed  for irritation (perineal discomfort). 11/01/21   McVey, Prudencio Pair, CNM  coconut oil OIL Apply 1 application topically as needed. 11/01/21   McVey, Prudencio Pair, CNM  docusate sodium (COLACE) 100 MG capsule Take 1 capsule (100 mg total) by mouth 2 (two) times daily. 11/01/21   McVey, Prudencio Pair, CNM  ferrous sulfate 325 (65 FE) MG tablet Take 1 tablet (325 mg total) by mouth daily with breakfast. 06/21/20   Haroldine Laws, CNM  ibuprofen (ADVIL) 600 MG tablet Take 1 tablet (600 mg total) by mouth every 6 (six) hours. 11/01/21   McVey, Prudencio Pair, CNM  magnesium 30 MG tablet Take 30 mg by mouth 2 (two) times daily.    [provider]  Prenatal Vit-Fe Fumarate-FA (PRENATAL MULTIVITAMIN) TABS tablet Take 1 tablet by mouth daily at 12 noon.    [provider]  sertraline (ZOLOFT) 25 MG tablet Take 25 mg by mouth daily.    [provider]  witch hazel-glycerin (TUCKS) pad Apply 1 application topically continuous. 11/01/21   McVey, Prudencio Pair, CNM    Family History History reviewed. No pertinent family history.  Social History Social History   Tobacco Use   Smoking status:  Never   Smokeless tobacco: Never  Vaping Use   Vaping status: Never Used  Substance Use Topics   Alcohol use: No   Drug use: No     Allergies   Patient has no known allergies.   Review of Systems Review of Systems As noted in HPI  Physical Exam Triage Vital Signs ED Triage Vitals  Encounter Vitals Group     BP 04/09/23 1941 125/85     Systolic BP Percentile --      Diastolic BP Percentile --      Pulse Rate 04/09/23 1941 88     Resp 04/09/23 1941 14     Temp 04/09/23 1941 98.7 F (37.1 C)     Temp Source 04/09/23 1941 Oral     SpO2 04/09/23 1941 97 %     Weight 04/09/23 1939 184 lb 15.5 oz (83.9 kg)     Height 04/09/23 1939 5\' 7"  (1.702 m)     Head Circumference --      Peak Flow --      Pain Score 04/09/23 1939 7     Pain Loc --      Pain Education --      Exclude from Growth Chart --     No data found.  Updated Vital Signs BP 125/85 (BP Location: Left Arm)   Pulse 88   Temp 98.7 F (37.1 C) (Oral)   Resp 14   Ht 5\' 7"  (1.702 m)   Wt 184 lb 15.5 oz (83.9 kg)   LMP 03/19/2023   SpO2 97%   Breastfeeding No   BMI 28.97 kg/m   Visual Acuity Right Eye Distance:   Left Eye Distance:   Bilateral Distance:    Right Eye Near:   Left Eye Near:    Bilateral Near:     Physical Exam Vitals and nursing note reviewed.  Constitutional:      General: She is in acute distress.     Comments: Who is in a lot of pain specially when she tries to move to attend her toddler.    HENT:     Right Ear: External ear normal.     Left Ear: External ear normal.  Eyes:     General: No scleral icterus.    Conjunctiva/sclera: Conjunctivae normal.  Pulmonary:     Effort: Pulmonary effort is normal.  Musculoskeletal:     Comments: BACK- has local tenderness on L4-S1 vertebral region, and soft tissue on L sacrum She could not flex laterally to her L due to pain, but had normal R lateral flexion. Anterior flexion was only to 30 degrees, and this would provoke more pain if she tried further.   Skin:    General: Skin is warm and dry.  Neurological:     Mental Status: She is alert and oriented to person, place, and time.     Gait: Gait normal.     Deep Tendon Reflexes: Reflexes normal.  Psychiatric:        Mood and Affect: Mood normal.        Behavior: Behavior normal.        Thought Content: Thought content normal.        Judgment: Judgment normal.     UC Treatments / Results  Labs (all labs ordered are listed, but only abnormal results are displayed) Labs Reviewed  URINALYSIS, W/ REFLEX TO CULTURE (INFECTION SUSPECTED) - Abnormal; Notable for the following components:      Result Value   Bacteria,  UA FEW (*)    All other components within normal limits  PREGNANCY, URINE    EKG   Radiology No results found.  Procedures Procedures (including critical care  time)  Medications Ordered in UC Medications  ketorolac (TORADOL) 30 MG/ML injection 30 mg (30 mg Intramuscular Given 04/09/23 2017)    Initial Impression / Assessment and Plan / UC Course  I have reviewed the triage vital signs and the nursing notes. She was given Toradol 30 mg IM here which helped her pain and was able to tolerate getting an xray.  Pertinent labs & imaging results that were available during my care of the patient were reviewed by me and considered in my medical decision making (see chart for details).  Chronic back pain with acute flair     Final Clinical Impressions(s) / UC Diagnoses   Final diagnoses:  Other chronic back pain   Discharge Instructions   None    ED Prescriptions   None    PDMP not reviewed this encounter.

## 2023-04-15 DIAGNOSIS — F410 Panic disorder [episodic paroxysmal anxiety] without agoraphobia: Secondary | ICD-10-CM | POA: Diagnosis not present

## 2023-04-23 DIAGNOSIS — F4322 Adjustment disorder with anxiety: Secondary | ICD-10-CM | POA: Diagnosis not present

## 2023-10-06 DIAGNOSIS — F419 Anxiety disorder, unspecified: Secondary | ICD-10-CM | POA: Diagnosis not present

## 2023-10-06 DIAGNOSIS — Z1329 Encounter for screening for other suspected endocrine disorder: Secondary | ICD-10-CM | POA: Diagnosis not present

## 2023-10-06 DIAGNOSIS — F900 Attention-deficit hyperactivity disorder, predominantly inattentive type: Secondary | ICD-10-CM | POA: Diagnosis not present

## 2023-10-06 DIAGNOSIS — N943 Premenstrual tension syndrome: Secondary | ICD-10-CM | POA: Diagnosis not present

## 2023-10-06 DIAGNOSIS — R7989 Other specified abnormal findings of blood chemistry: Secondary | ICD-10-CM | POA: Diagnosis not present

## 2023-10-06 DIAGNOSIS — N926 Irregular menstruation, unspecified: Secondary | ICD-10-CM | POA: Diagnosis not present

## 2023-10-20 DIAGNOSIS — F419 Anxiety disorder, unspecified: Secondary | ICD-10-CM | POA: Diagnosis not present

## 2023-10-20 DIAGNOSIS — F900 Attention-deficit hyperactivity disorder, predominantly inattentive type: Secondary | ICD-10-CM | POA: Diagnosis not present

## 2023-10-28 DIAGNOSIS — F419 Anxiety disorder, unspecified: Secondary | ICD-10-CM | POA: Diagnosis not present

## 2023-10-28 DIAGNOSIS — F900 Attention-deficit hyperactivity disorder, predominantly inattentive type: Secondary | ICD-10-CM | POA: Diagnosis not present

## 2023-11-10 DIAGNOSIS — F900 Attention-deficit hyperactivity disorder, predominantly inattentive type: Secondary | ICD-10-CM | POA: Diagnosis not present

## 2023-11-10 DIAGNOSIS — F419 Anxiety disorder, unspecified: Secondary | ICD-10-CM | POA: Diagnosis not present

## 2023-12-27 DIAGNOSIS — F900 Attention-deficit hyperactivity disorder, predominantly inattentive type: Secondary | ICD-10-CM | POA: Diagnosis not present

## 2023-12-27 DIAGNOSIS — F419 Anxiety disorder, unspecified: Secondary | ICD-10-CM | POA: Diagnosis not present

## 2024-01-26 DIAGNOSIS — F900 Attention-deficit hyperactivity disorder, predominantly inattentive type: Secondary | ICD-10-CM | POA: Diagnosis not present

## 2024-01-26 DIAGNOSIS — F419 Anxiety disorder, unspecified: Secondary | ICD-10-CM | POA: Diagnosis not present

## 2024-06-12 DIAGNOSIS — F419 Anxiety disorder, unspecified: Secondary | ICD-10-CM | POA: Diagnosis not present

## 2024-06-12 DIAGNOSIS — F900 Attention-deficit hyperactivity disorder, predominantly inattentive type: Secondary | ICD-10-CM | POA: Diagnosis not present
